# Patient Record
Sex: Female | Born: 1970
Health system: Southern US, Community
[De-identification: ages and names within clinical notes are randomized; demographics above are authoritative.]

## PROBLEM LIST (undated history)

## (undated) DIAGNOSIS — L719 Rosacea, unspecified: Secondary | ICD-10-CM

## (undated) DIAGNOSIS — N879 Dysplasia of cervix uteri, unspecified: Secondary | ICD-10-CM

## (undated) DIAGNOSIS — Z803 Family history of malignant neoplasm of breast: Secondary | ICD-10-CM

## (undated) DIAGNOSIS — Z1371 Encounter for nonprocreative screening for genetic disease carrier status: Secondary | ICD-10-CM

## (undated) HISTORY — DX: Encounter for nonprocreative screening for genetic disease carrier status: Z13.71

## (undated) HISTORY — DX: Family history of malignant neoplasm of breast: Z80.3

## (undated) HISTORY — DX: Rosacea, unspecified: L71.9

## (undated) HISTORY — DX: Dysplasia of cervix uteri, unspecified: N87.9

---

## 1996-07-03 HISTORY — PX: CRYOTHERAPY: SHX1416

## 2010-01-31 ENCOUNTER — Ambulatory Visit: Payer: Self-pay | Admitting: Internal Medicine

## 2011-11-03 DIAGNOSIS — Z1371 Encounter for nonprocreative screening for genetic disease carrier status: Secondary | ICD-10-CM

## 2011-11-03 HISTORY — DX: Encounter for nonprocreative screening for genetic disease carrier status: Z13.71

## 2014-02-28 ENCOUNTER — Ambulatory Visit: Payer: Self-pay

## 2015-02-20 ENCOUNTER — Other Ambulatory Visit: Payer: Self-pay | Admitting: Certified Nurse Midwife

## 2015-02-20 DIAGNOSIS — Z1231 Encounter for screening mammogram for malignant neoplasm of breast: Secondary | ICD-10-CM

## 2015-03-04 ENCOUNTER — Ambulatory Visit
Admission: RE | Admit: 2015-03-04 | Discharge: 2015-03-04 | Disposition: A | Discharge status: Home / Self Care | Payer: BLUE CROSS/BLUE SHIELD | Source: Ambulatory Visit | Attending: Certified Nurse Midwife | Admitting: Certified Nurse Midwife

## 2015-03-04 ENCOUNTER — Other Ambulatory Visit: Payer: Self-pay | Admitting: Certified Nurse Midwife

## 2015-03-04 DIAGNOSIS — Z1231 Encounter for screening mammogram for malignant neoplasm of breast: Secondary | ICD-10-CM | POA: Diagnosis not present

## 2016-03-02 ENCOUNTER — Other Ambulatory Visit: Payer: Self-pay | Admitting: Certified Nurse Midwife

## 2016-04-09 ENCOUNTER — Ambulatory Visit (INDEPENDENT_AMBULATORY_CARE_PROVIDER_SITE_OTHER): Payer: BLUE CROSS/BLUE SHIELD | Admitting: Certified Nurse Midwife

## 2016-04-09 ENCOUNTER — Encounter: Payer: Self-pay | Admitting: Certified Nurse Midwife

## 2016-04-09 VITALS — BP 92/62 | HR 74 | Ht 61.0 in | Wt 114.0 lb

## 2016-04-09 DIAGNOSIS — Z01419 Encounter for gynecological examination (general) (routine) without abnormal findings: Secondary | ICD-10-CM

## 2016-04-09 DIAGNOSIS — Z124 Encounter for screening for malignant neoplasm of cervix: Secondary | ICD-10-CM | POA: Diagnosis not present

## 2016-04-09 DIAGNOSIS — Z803 Family history of malignant neoplasm of breast: Secondary | ICD-10-CM

## 2016-04-09 DIAGNOSIS — Z87410 Personal history of cervical dysplasia: Secondary | ICD-10-CM | POA: Insufficient documentation

## 2016-04-09 NOTE — Progress Notes (Signed)
Gynecology Annual Exam  PCP: No PCP Per Patient  Chief Complaint:  Chief Complaint  Patient presents with  . Gynecologic Exam    History of Present Illness: Patient is a 46 y.o. [id number] presents for annual exam. The patient has no complaints today.   LMP: Patient's last menstrual period was 03/26/2016 (exact date). Menses are regular, every 21 days, lasting 5 days with 2-3 heavier days accompanied by cramping. She takes ibuprofen 400 mgm q4 hrs prn pain with relief. She has no intermenstrual spotting  The patient is sexually active. She currently uses vasectomy for contraception.  Since her most recent annual exam 02/13/2015, she has had no changes in her health history, however her mother died at age 60 from metastatic breast cancer. Her most recent Pap smear was 02/13/2015 and was NIL/neg. Her most recent mammogram was 03/05/2015 and was negative. There is a positive history of breast cancer in her maternal grandmother, maternal great aunt, and mother. Genetic testing was done. The patient and mother tested negative for HBOC.  Patient's lifetime risk of breast cancer is 23.1%. She has declined MRIs due to cost, but does get 3D mammograms annually.  There is no family history of ovarian cancer The patient does do monthly self breast exams. SHe does not smoke or drink alcohol The patient does get some exercise by walking. The patient gets adequate calcium in her diet and calcium supplement. SHe had a recent cholesterol screen in 2015 that was normal.  Review of Systems: Review of Systems  Constitutional: Negative for chills, fever and weight loss.  HENT: Negative for congestion, sinus pain and sore throat.   Eyes: Negative for blurred vision and pain.  Respiratory: Negative for hemoptysis, shortness of breath and wheezing.   Cardiovascular: Negative for chest pain, palpitations and leg swelling.  Gastrointestinal: Negative for abdominal pain, blood in stool, diarrhea, heartburn, nausea  and vomiting.  Genitourinary: Negative for dysuria, frequency, hematuria and urgency.       Positive for dysmenorrhea  Musculoskeletal: Negative for back pain, joint pain and myalgias.  Skin: Negative for itching and rash.  Neurological: Negative for dizziness, tingling and headaches.  Endo/Heme/Allergies: Negative for environmental allergies and polydipsia. Does not bruise/bleed easily.       Negative for hirsutism   Psychiatric/Behavioral: Negative for depression. The patient is not nervous/anxious and does not have insomnia.   All other systems reviewed and are negative.    Past Medical History:  Past Medical History:  Diagnosis Date  . BRCA negative 11/2011  . Cervical dysplasia    1998  . Family history of breast cancer     Past Surgical History:  Past Surgical History:  Procedure Laterality Date  . CRYOTHERAPY  07/03/1996   CIN 1    Medications: Prior to Admission medications   Medication Sig Start Date End Date Taking? Authorizing Provider  Calcium Carb-Cholecalciferol (CALCIUM-VITAMIN D) 500-200 MG-UNIT tablet Take 1 tablet by mouth daily.   Yes Historical Provider, MD  ibuprofen (ADVIL,MOTRIN) 200 MG tablet Take 400 mg by mouth every 4 (four) hours as needed for cramping.   Yes Historical Provider, MD    Allergies:  Allergies  Allergen Reactions  . Erythromycin Base     Gynecologic History: Obstetric History: [id number]  Social History:  Social History   Social History  . Marital status: Married    Spouse name: N/A  . Number of children: 2  . Years of education: N/A   Occupational History  . Not on file.  Social History Main Topics  . Smoking status: Never Smoker  . Smokeless tobacco: Never Used  . Alcohol use No  . Drug use: No  . Sexual activity: Yes   Other Topics Concern  . Not on file   Social History Narrative  . No narrative on file    Family History:  Family History  Problem Relation Age of Onset  . Breast cancer Mother 33  .  Stomach cancer Mother   . Heart failure Father   . Breast cancer Maternal Grandmother 20  . Skin cancer Maternal Grandmother   . Stomach cancer Maternal Grandmother   . Pancreatic cancer Maternal Grandfather   . Breast cancer Other   . Breast cancer Other      Physical Exam Vitals:  Vitals:   04/09/16 1534  BP: 92/62  Pulse: 74    Physical Exam  Constitutional: She is oriented to person, place, and time and well-developed, well-nourished, and in no distress.  HENT:  Head: Normocephalic.  Mouth/Throat: Oropharynx is clear and moist.  Eyes: Conjunctivae are normal.  Neck: Normal range of motion. No thyromegaly present.  Cardiovascular: Normal rate and regular rhythm.   No murmur heard. Pulmonary/Chest: Effort normal. She has no wheezes. She has no rales. She exhibits no tenderness.  Abdominal: Soft. She exhibits no distension and no mass. There is no tenderness. There is no guarding.  Genitourinary:  Genitourinary Comments: Vulva: no lesions or inflammation Vagina: no inflammation, scant clear discharge Cervix: stenotic os, NT Uterus: AV, nssc, mobile, NT Adnexa: no masses, NT Rectal: deferred  Musculoskeletal: She exhibits no edema.  Lymphadenopathy:    She has no cervical adenopathy.  Neurological: She is alert and oriented to person, place, and time. Gait normal.  Skin: Skin is warm and dry. No rash noted.  Psychiatric: Mood, affect and judgment normal.      Assessment: 46 y.o. W2N5621 with normal well woman exam Plan: Problem List Items Addressed This Visit    Family history of breast cancer    Other Visit Diagnoses    Encounter for gynecological examination    -  Primary   Relevant Orders   IGP,rfx Aptima HPV all pth   Screening for cervical cancer       Relevant Orders   IGP,rfx Aptima HPV all pth       1) Pap done  2) Routine healthcare maintenance including cholesterol, diabetes screening in 2020  3) Follow up 1 year for routine annual  exam  4) Patient to schedule annual screening 3D mammogram at [facility]  5). Discussed getting update on genetic screening with MYRISK testing. Patient to consider and let me know if she desires.  Dalia Heading, CNM  04/09/2016 5:12 PM

## 2016-04-10 ENCOUNTER — Other Ambulatory Visit: Payer: Self-pay | Admitting: Certified Nurse Midwife

## 2016-04-10 DIAGNOSIS — Z1231 Encounter for screening mammogram for malignant neoplasm of breast: Secondary | ICD-10-CM

## 2016-04-14 LAB — IGP,RFX APTIMA HPV ALL PTH: PAP Smear Comment: 0

## 2016-05-11 ENCOUNTER — Ambulatory Visit
Admission: RE | Admit: 2016-05-11 | Discharge: 2016-05-11 | Disposition: A | Discharge status: Home / Self Care | Payer: BLUE CROSS/BLUE SHIELD | Source: Ambulatory Visit | Attending: Certified Nurse Midwife | Admitting: Certified Nurse Midwife

## 2016-05-11 DIAGNOSIS — Z1231 Encounter for screening mammogram for malignant neoplasm of breast: Secondary | ICD-10-CM

## 2017-03-03 ENCOUNTER — Encounter: Payer: Self-pay | Admitting: Certified Nurse Midwife

## 2017-03-03 ENCOUNTER — Ambulatory Visit: Payer: BLUE CROSS/BLUE SHIELD | Admitting: Certified Nurse Midwife

## 2017-03-03 VITALS — BP 102/62 | HR 80 | Ht 61.0 in | Wt 118.0 lb

## 2017-03-03 DIAGNOSIS — N951 Menopausal and female climacteric states: Secondary | ICD-10-CM | POA: Diagnosis not present

## 2017-03-12 ENCOUNTER — Encounter: Payer: Self-pay | Admitting: Certified Nurse Midwife

## 2017-03-12 NOTE — Progress Notes (Signed)
Obstetrics & Gynecology Office Visit   Chief Complaint:  Chief Complaint  Patient presents with  . Menopause    menopausal symptoms    History of Present Illness: 47 year old G2P2, LMP 01/02/2018, presents with complaints of hot flashes and irregular menses. Hot flashes started shortly after her last annual in March 2018. They are worse at night disrupting her sleep. Has about 4 hot flashes during the day, but they do not bother her as much. Her menses are every 3-4 weeks apart, but reports 2 menses a month for the past 2 months. She desires information on the treatment of menopausal sx, specifically vasomotor symptoms. There is a past family history of breast cancer  In her mother, MGM, MGGM, and maternal great aunt. She has had HBOC testing which was negative. Her lifetime risk of breast cancer is 23%.   LAst mammogram 05/2016 was negative. Has declined MRIs of the breast and MYRISK testing update. Denies history of heart, liver, migraines, or clotting disorder.  Review of Systems:  ROS -see HPI  Past Medical History:  Past Medical History:  Diagnosis Date  . BRCA negative 11/2011  . Cervical dysplasia    1998  . Family history of breast cancer    mother, MGM, MGGM, mat great aunt    Past Surgical History:  Past Surgical History:  Procedure Laterality Date  . CRYOTHERAPY  07/03/1996   CIN 1    Gynecologic History: Patient's last menstrual period was 01/02/2017 (exact date).  Obstetric History: [id number]  Family History:  Family History  Problem Relation Age of Onset  . Breast cancer Mother 64  . Stomach cancer Mother   . Heart failure Father   . Breast cancer Maternal Grandmother 32  . Skin cancer Maternal Grandmother   . Stomach cancer Maternal Grandmother   . Pancreatic cancer Maternal Grandfather   . Breast cancer Other   . Breast cancer Other     Social History:  Social History   Socioeconomic History  . Marital status: Married    Spouse name: Not on file   . Number of children: 2  . Years of education: Not on file  . Highest education level: Not on file  Social Needs  . Financial resource strain: Not on file  . Food insecurity - worry: Not on file  . Food insecurity - inability: Not on file  . Transportation needs - medical: Not on file  . Transportation needs - non-medical: Not on file  Occupational History  . Not on file  Tobacco Use  . Smoking status: Never Smoker  . Smokeless tobacco: Never Used  Substance and Sexual Activity  . Alcohol use: No  . Drug use: No  . Sexual activity: Yes    Birth control/protection: Other-see comments    Comment: vasectomy  Other Topics Concern  . Not on file  Social History Narrative  . Not on file    Allergies:  Allergies  Allergen Reactions  . Erythromycin Base     Medications: Prior to Admission medications   Medication Sig Start Date End Date Taking? Authorizing Provider  Calcium Carb-Cholecalciferol (CALCIUM-VITAMIN D) 500-200 MG-UNIT tablet Take 1 tablet by mouth daily.   Yes [provider]  ibuprofen (ADVIL,MOTRIN) 200 MG tablet Take 400 mg by mouth every 4 (four) hours as needed for cramping.   Yes [provider]    Physical Exam Vitals:BP 102/62   Pulse 80   Ht '5\' 1"'$  (1.549 m)   Wt 118 lb (  53.5 kg)   LMP 01/02/2017 (Exact Date)   BMI 22.30 kg/m  Patient's last menstrual period was 01/02/2017 (exact date).  Physical Exam  Constitutional: She is oriented to person, place, and time. She appears well-developed and well-nourished. No distress.  Cardiovascular: Normal rate.  Respiratory: Effort normal.  Neurological: She is alert and oriented to person, place, and time.  Skin: Skin is warm and dry.  Psychiatric: She has a normal mood and affect. Her behavior is normal. Judgment and thought content normal.     Assessment: 47 y.o. J1T9539 with perimenopausal symptoms  Plan: Menstrual calendar Discussed treatment of vasomotor symptoms with hormonal  meds , prescription nonhormonal medications (Brisdelle, Fosteum), OTC medications (black cohash, phytoestrogens, vitamin E).  Discussed pros and cons of each discussed. Reviewed WHI study results.  Patient decided to use OTC treatment first. To keep menstrual calande and follow up at time of her annual in March  Sigrid Schwebach [place], [place]

## 2017-04-12 ENCOUNTER — Ambulatory Visit (INDEPENDENT_AMBULATORY_CARE_PROVIDER_SITE_OTHER): Payer: BLUE CROSS/BLUE SHIELD | Admitting: Certified Nurse Midwife

## 2017-04-12 ENCOUNTER — Encounter: Payer: Self-pay | Admitting: Certified Nurse Midwife

## 2017-04-12 VITALS — BP 100/60 | HR 73 | Ht 61.0 in | Wt 118.0 lb

## 2017-04-12 DIAGNOSIS — Z124 Encounter for screening for malignant neoplasm of cervix: Secondary | ICD-10-CM

## 2017-04-12 DIAGNOSIS — Z01419 Encounter for gynecological examination (general) (routine) without abnormal findings: Secondary | ICD-10-CM

## 2017-04-12 DIAGNOSIS — Z1239 Encounter for other screening for malignant neoplasm of breast: Secondary | ICD-10-CM

## 2017-04-12 DIAGNOSIS — Z1231 Encounter for screening mammogram for malignant neoplasm of breast: Secondary | ICD-10-CM | POA: Diagnosis not present

## 2017-04-12 DIAGNOSIS — N926 Irregular menstruation, unspecified: Secondary | ICD-10-CM

## 2017-04-12 DIAGNOSIS — Z9189 Other specified personal risk factors, not elsewhere classified: Secondary | ICD-10-CM

## 2017-04-12 NOTE — Progress Notes (Signed)
Gynecology Annual Exam  PCP: Patient, No Pcp Per  Chief Complaint:  Chief Complaint  Patient presents with  . Gynecologic Exam    History of Present Illness: Patient is a 47PY [id number] presents for annual exam. The patient has had some problems with irregular and heavier menses and vasomotor symptoms. Her menses had become more frequent last year, but she skipped January and February this year and had her first menses in 2019 from 3/4 to 3/10. She had 2 heavier days requiring pad changes every 1 hour. The heavier days were accompanied by cramping. She takes ibuprofen 400 mgm two or three times a day with relief. She has no intermenstrual spotting She is taking Vitamin E 400 IU BID for hot flashes. The hot flashes are not as frequent or bothersome.  The patient is sexually active. She currently uses vasectomy for contraception.  Since her most recent annual exam 04/09/2016, she has had no changes in her health history. Her most recent Pap smear was 04/09/2016 and was NIL Remote hx of CIN1 1998. Had cryotherapy. Her most recent mammogram was 05/11/2016 and was negative. There is a positive history of breast cancer in her maternal grandmother, maternal great aunt, and mother. Genetic testing was done. The patient and mother tested negative for HBOC.  Patient's lifetime risk of breast cancer is 23.1%. She has declined MRIs due to cost, but does get 3D mammograms annually. She has also declined MYRISK testing. There is no family history of ovarian cancer The patient does do monthly self breast exams. SHe does not smoke or drink alcohol The patient does get some exercise by walking (6000steps/day) The patient gets adequate calcium in her diet and calcium supplement. She had a recent cholesterol screen in the last 2-3 years at work that was normal  Review of Systems: Review of Systems  Constitutional: Negative for chills, fever and weight loss.  HENT: Negative for congestion, sinus pain and  sore throat.   Eyes: Negative for blurred vision and pain.  Respiratory: Negative for hemoptysis, shortness of breath and wheezing.   Cardiovascular: Negative for chest pain, palpitations and leg swelling.  Gastrointestinal: Negative for abdominal pain, blood in stool, diarrhea, heartburn, nausea and vomiting.  Genitourinary: Negative for dysuria, frequency, hematuria and urgency.       Positive for dysmenorrhea, irregular menses  Musculoskeletal: Negative for back pain, joint pain and myalgias.  Skin: Negative for itching and rash.  Neurological: Negative for dizziness, tingling and headaches.  Endo/Heme/Allergies: Negative for environmental allergies and polydipsia. Does not bruise/bleed easily.       Negative for hirsutism. Positive for hot flashes   Psychiatric/Behavioral: Negative for depression. The patient is not nervous/anxious and does not have insomnia.   All other systems reviewed and are negative.    Past Medical History:  Past Medical History:  Diagnosis Date  . BRCA negative 11/2011  . Cervical dysplasia    1998  . Family history of breast cancer    mother, MGM, MGGM, mat great aunt  . Rosacea     Past Surgical History:  Past Surgical History:  Procedure Laterality Date  . CRYOTHERAPY  07/03/1996   CIN 1    Medications: Allergies:  Allergies  Allergen Reactions  . Erythromycin Base     Gynecologic History: Obstetric History: [id number]  Social History:  Social History   Socioeconomic History  . Marital status: Married    Spouse name: Not on file  . Number of children: 2  .  Years of education: Not on file  . Highest education level: Not on file  Social Needs  . Financial resource strain: Not on file  . Food insecurity - worry: Not on file  . Food insecurity - inability: Not on file  . Transportation needs - medical: Not on file  . Transportation needs - non-medical: Not on file  Occupational History  . Occupation: Accounting  Tobacco Use  .  Smoking status: Never Smoker  . Smokeless tobacco: Never Used  Substance and Sexual Activity  . Alcohol use: No  . Drug use: No  . Sexual activity: Yes    Birth control/protection: Other-see comments    Comment: vasectomy  Other Topics Concern  . Not on file  Social History Narrative  . Not on file    Family History:  Family History  Problem Relation Age of Onset  . Breast cancer Mother 76  . Stomach cancer Mother 31  . Valvular heart disease Father        mitral valve surgery  . Breast cancer Maternal Grandmother 30  . Stomach cancer Maternal Grandmother 77  . Melanoma Maternal Grandmother   . Pancreatic cancer Maternal Grandfather   . Breast cancer Other   . Breast cancer Other    Medication:  Current Outpatient Medications:  .  Calcium Carb-Cholecalciferol (CALCIUM-VITAMIN D) 500-200 MG-UNIT tablet, Take 1 tablet by mouth daily., Disp: , Rfl:  .  ibuprofen (ADVIL,MOTRIN) 200 MG tablet, Take 400 mg by mouth every 4 (four) hours as needed for cramping., Disp: , Rfl:  .  metroNIDAZOLE (METROCREAM) 0.75 % cream, Apply 1 application topically 2 (two) times daily., Disp: , Rfl:  .  vitamin E 400 UNIT capsule, Take 400 Units by mouth 2 (two) times daily., Disp: , Rfl:   Physical Exam Vitals:BP 100/60   Pulse 73   Ht '5\' 1"'$  (1.549 m)   Wt 118 lb (53.5 kg)   LMP 04/05/2017 (Exact Date)   BMI 22.30 kg/m    Physical Exam  Constitutional: She is oriented to person, place, and time and well-developed, well-nourished, and in no distress.  HENT:  Head: Normocephalic.  Eyes: Conjunctivae are normal.  Neck: Normal range of motion. No thyromegaly present. No cervical lymphadenopathy Cardiovascular: Normal rate and regular rhythm.   No murmur heard. Pulmonary/Chest: Effort normal. She has no wheezes. She has no rales. She exhibits no tenderness.  Breast: soft, no masses, no skin changes, no nipple retraction. No axillary, supraclavicular or infraclavicular  lymphadenopathy. Abdominal: Soft. She exhibits no distension and no mass. There is no tenderness. There is no guarding.  Genitourinary:  Genitourinary Comments: Vulva: no lesions or inflammation Vagina: no inflammation, scant clear discharge Cervix: stenotic os, NT Uterus: AV, nssc, mobile, NT Adnexa: no masses, NT Rectal: deferred  Musculoskeletal: She exhibits no edema.  Lymphadenopathy:    She has no cervical adenopathy.  Neurological: She is alert and oriented to person, place, and time. Gait normal.  Skin: Skin is warm and dry. No rash noted.  Psychiatric: Mood, affect and judgment normal.      Assessment: 47 y.o. [id number] with normal well woman exam Plan:  1) Pap done  2) Routine healthcare maintenance including cholesterol, diabetes screening done through work  3) Follow up 1 year for routine annual exam  4) Patient to schedule annual screening 3D mammogram at [facility]  5). Discussed getting update on genetic screening with MYRISK testing. Patient to consider and let me know if she desires.  6) Encouraged some exercises/strength  training to help prevent osteoporosis. Reviewed calcium and vitamin D3 requirements.  7) Continue menstrual calendar and let me know about prolonged, too frequent, intermenstrual or persistent heavy bleeding.  Dalia Heading, CNM  04/19/2017 4:37 PM

## 2017-04-13 LAB — IGP,RFX APTIMA HPV ALL PTH: PAP Smear Comment: 0

## 2017-04-19 ENCOUNTER — Encounter: Payer: Self-pay | Admitting: Certified Nurse Midwife

## 2017-04-19 DIAGNOSIS — Z9189 Other specified personal risk factors, not elsewhere classified: Secondary | ICD-10-CM | POA: Insufficient documentation

## 2017-05-12 ENCOUNTER — Ambulatory Visit
Admission: RE | Admit: 2017-05-12 | Discharge: 2017-05-12 | Disposition: A | Discharge status: Home / Self Care | Payer: BLUE CROSS/BLUE SHIELD | Source: Ambulatory Visit | Attending: Certified Nurse Midwife | Admitting: Certified Nurse Midwife

## 2017-05-12 DIAGNOSIS — Z1231 Encounter for screening mammogram for malignant neoplasm of breast: Secondary | ICD-10-CM | POA: Diagnosis not present

## 2017-05-12 DIAGNOSIS — Z1239 Encounter for other screening for malignant neoplasm of breast: Secondary | ICD-10-CM

## 2017-06-09 DIAGNOSIS — L719 Rosacea, unspecified: Secondary | ICD-10-CM | POA: Insufficient documentation

## 2018-05-06 ENCOUNTER — Ambulatory Visit: Payer: BLUE CROSS/BLUE SHIELD | Admitting: Certified Nurse Midwife

## 2018-06-06 ENCOUNTER — Other Ambulatory Visit (HOSPITAL_COMMUNITY)
Admission: RE | Admit: 2018-06-06 | Discharge: 2018-06-06 | Disposition: A | Discharge status: Home / Self Care | Payer: BLUE CROSS/BLUE SHIELD | Source: Ambulatory Visit | Attending: Certified Nurse Midwife | Admitting: Certified Nurse Midwife

## 2018-06-06 ENCOUNTER — Ambulatory Visit (INDEPENDENT_AMBULATORY_CARE_PROVIDER_SITE_OTHER): Payer: BLUE CROSS/BLUE SHIELD | Admitting: Certified Nurse Midwife

## 2018-06-06 ENCOUNTER — Encounter: Payer: Self-pay | Admitting: Certified Nurse Midwife

## 2018-06-06 ENCOUNTER — Other Ambulatory Visit: Payer: Self-pay

## 2018-06-06 VITALS — BP 100/60 | Ht 61.0 in | Wt 118.0 lb

## 2018-06-06 DIAGNOSIS — Z124 Encounter for screening for malignant neoplasm of cervix: Secondary | ICD-10-CM | POA: Diagnosis not present

## 2018-06-06 DIAGNOSIS — Z01419 Encounter for gynecological examination (general) (routine) without abnormal findings: Secondary | ICD-10-CM

## 2018-06-06 DIAGNOSIS — Z9189 Other specified personal risk factors, not elsewhere classified: Secondary | ICD-10-CM

## 2018-06-06 DIAGNOSIS — Z1211 Encounter for screening for malignant neoplasm of colon: Secondary | ICD-10-CM | POA: Diagnosis not present

## 2018-06-06 DIAGNOSIS — Z1239 Encounter for other screening for malignant neoplasm of breast: Secondary | ICD-10-CM

## 2018-06-06 LAB — HEMOCCULT GUIAC POC 1CARD (OFFICE): Fecal Occult Blood, POC: NEGATIVE

## 2018-06-06 MED ORDER — PAROXETINE HCL 10 MG PO TABS: 10.0000 mg | ORAL_TABLET | Freq: Every day | ORAL | 11 refills | Status: DC

## 2018-06-06 NOTE — Progress Notes (Addendum)
Gynecology Annual Exam  PCP: Patient, No Pcp Per  Chief Complaint:  Chief Complaint  Patient presents with  . Gynecologic Exam    History of Present Illness: Cheyenne Cook  is a 48yo WF [id number] who presents for her annual exam. The patient has had some problems with irregular and heavier menses and vasomotor symptoms x2 years. Her menses had become less frequent over the last year and have been every 1-4.5 months apart, lasting 6-10 days with 2-7 heavier days requiring a pad change every 1-2 hours. She had her LMP Jan30-Feb 4.  Often times she would soil her clothes overnight. The heavier days were also accompanied by cramping. She takes ibuprofen 400 mgm two or three times a day with relief. She has no intermenstrual spotting She is taking Vitamin E 400 IU BID for hot flashes. The hot flashes have become more frequent since February. She reports awakening 6 times during the night with hot flashes. .  The patient is sexually active. She currently uses vasectomy for contraception.  Since her most recent annual exam 04/12/2017, she has had no other changes in her health history. Her most recent Pap smear was 04/12/17 and was NIL Remote hx of CIN1 1998. Had cryotherapy. Her most recent mammogram was 05/12/2017 and was negative. There is a positive history of breast cancer in her maternal grandmother, maternal great aunt, and mother. Genetic testing was done. The patient and mother tested negative for HBOC.  Patient's lifetime risk of breast cancer is 23.1%. She has declined MRIs due to cost, but does get 3D mammograms annually. She has also declined MYRISK testing. There is no family history of ovarian cancer The patient does do monthly self breast exams. SHe does not smoke or drink alcohol The patient does get some exercise by walking (6000steps/day) The patient gets adequate calcium in her diet and calcium supplement. She had a recent cholesterol screen in the last year at work that was  normal. She also reports that her CBC was normal. I have asked her to fax me those results  Review of Systems: Review of Systems  Constitutional: Negative for chills, fever and weight loss.  HENT: Negative for congestion, sinus pain and sore throat.   Eyes: Negative for blurred vision and pain.  Respiratory: Negative for hemoptysis, shortness of breath and wheezing.   Cardiovascular: Negative for chest pain, palpitations and leg swelling.  Gastrointestinal: Negative for abdominal pain, blood in stool, diarrhea, heartburn, nausea and vomiting.  Genitourinary: Negative for dysuria, frequency, hematuria and urgency.       Positive for dysmenorrhea, irregular menses, menorrhagia  Musculoskeletal: Negative for back pain, joint pain and myalgias.  Skin: Negative for itching and rash.  Neurological: Negative for dizziness, tingling and headaches.  Endo/Heme/Allergies: Negative for environmental allergies and polydipsia. Does not bruise/bleed easily.       Negative for hirsutism. Positive for hot flashes   Psychiatric/Behavioral: Negative for depression. The patient is not nervous/anxious and does not have insomnia.   All other systems reviewed and are negative.    Past Medical History:  Past Medical History:  Diagnosis Date  . BRCA negative 11/2011  . Cervical dysplasia    1998  . Family history of breast cancer    mother, MGM, MGGM, mat great aunt  . Rosacea     Past Surgical History:  Past Surgical History:  Procedure Laterality Date  . CRYOTHERAPY  07/03/1996   CIN 1    Medications: Allergies:  Allergies  Allergen Reactions  . Erythromycin Base     Gynecologic History: Obstetric History: [id number]  Social History:  Social History   Socioeconomic History  . Marital status: Married    Spouse name: Not on file  . Number of children: 2  . Years of education: Not on file  . Highest education level: Not on file  Social Needs  . Financial resource strain: Not on file  .  Food insecurity - worry: Not on file  . Food insecurity - inability: Not on file  . Transportation needs - medical: Not on file  . Transportation needs - non-medical: Not on file  Occupational History  . Occupation: Accounting  Tobacco Use  . Smoking status: Never Smoker  . Smokeless tobacco: Never Used  Substance and Sexual Activity  . Alcohol use: No  . Drug use: No  . Sexual activity: Yes    Birth control/protection: Other-see comments    Comment: vasectomy  Other Topics Concern  . Not on file  Social History Narrative  . Not on file    Family History:  Family History  Problem Relation Age of Onset  . Breast cancer Mother 33  . Stomach cancer Mother 39  . Valvular heart disease Father        mitral valve surgery  . Breast cancer Maternal Grandmother 16  . Stomach cancer Maternal Grandmother 77  . Melanoma Maternal Grandmother   . Pancreatic cancer Maternal Grandfather   . Breast cancer Other   . Breast cancer Other    Medication:  Current Outpatient Medications:  Current Outpatient Medications on File Prior to Visit  Medication Sig Dispense Refill  . Calcium Carb-Cholecalciferol (CALCIUM-VITAMIN D) 500-200 MG-UNIT tablet Take 1 tablet by mouth daily.    . Cholecalciferol (VITAMIN D3) 25 MCG (1000 UT) CAPS Take 1 capsule by mouth daily.    . metroNIDAZOLE (METROCREAM) 0.75 % cream Apply 1 application topically 2 (two) times daily.    . vitamin E 400 UNIT capsule Take 400 Units by mouth 2 (two) times daily.    Marland Kitchen ibuprofen (ADVIL,MOTRIN) 200 MG tablet Take 400 mg by mouth every 4 (four) hours as needed for cramping.     No current facility-administered medications on file prior to visit.        Physical Exam Vitals: BP 100/60   Ht '5\' 1"'$  (1.549 m)   Wt 118 lb (53.5 kg)   LMP 03/08/2018   BMI 22.30 kg/m   Physical Exam  Constitutional: She is oriented to person, place, and time and well-developed, well-nourished, and in no distress.  HENT:  Head:  Normocephalic.  Eyes: Conjunctivae are normal.  Neck: Normal range of motion. No thyromegaly present. No cervical lymphadenopathy Cardiovascular: Normal rate and regular rhythm.   No murmur heard. Pulmonary/Chest: Effort normal. She has no wheezes. She has no rales.  Breast: soft, no masses, no skin changes, no nipple retraction. No axillary, supraclavicular or infraclavicular lymphadenopathy. Abdominal: Soft. She exhibits no distension and no mass. There is no tenderness. There is no guarding.  Genitourinary:  Genitourinary Comments: Vulva: no lesions or inflammation Vagina: no inflammation, scant clear discharge Cervix: stenotic os, NT, anterior, cervix almost flush with posterior fornix. Uterus: RF, nssc, mobile, NT Adnexa: no masses, NT Rectal: no masses, hemoccult negative. Musculoskeletal: She exhibits no edema.  Lymphadenopathy:    She has no cervical adenopathy.  Neurological: She is alert and oriented to person, place, and time. Gait normal.  Skin: Skin is warm and dry. No rash noted.  Psychiatric: Mood, affect and judgment normal.      Assessment: 48 y.o. [id number] with normal well woman exam Perimenopausal bleeding/ vasomotor symptoms Family history of breast cancer Increased risk of breast cancer  Plan:  1) Cervical cancer screening:Pap done (patient desires Pap smears every year)  2) Routine healthcare maintenance including cholesterol, diabetes screening done through work  3) Colon cancer screening:  Discussed options and she desires annual hemoccult testing. This was negative today.  4) Patient to schedule annual screening 3D mammogram at [facility] when Covid 19 restrictions allow. Discussed getting update  MYRISK testing in the past and she has declines  5) Vasomotor symptoms: explained non hormonal options to treat vasomotor symptoms. She would like trial of Brisdelle. Will try generic Paxil 10 mgm daily. DIscussed possible side effects.   6) Osteoporosis  prevention:.calcium and vitamin D3 supplements/ exercise  7) RTO 1 year and prn. Continue menstrual calendar and let me know about prolonged, too frequent, intermenstrual or persistent heavy bleeding.  Dalia Heading, CNM  04/19/2017 4:37 PM

## 2018-06-07 ENCOUNTER — Encounter: Payer: Self-pay | Admitting: Certified Nurse Midwife

## 2018-06-08 LAB — CYTOLOGY - PAP
Diagnosis: NEGATIVE
HPV: NOT DETECTED

## 2018-08-09 ENCOUNTER — Other Ambulatory Visit: Payer: Self-pay

## 2018-08-09 ENCOUNTER — Ambulatory Visit
Admission: RE | Admit: 2018-08-09 | Discharge: 2018-08-09 | Disposition: A | Discharge status: Home / Self Care | Payer: BC Managed Care – PPO | Source: Ambulatory Visit | Attending: Certified Nurse Midwife | Admitting: Certified Nurse Midwife

## 2018-08-09 DIAGNOSIS — Z1239 Encounter for other screening for malignant neoplasm of breast: Secondary | ICD-10-CM

## 2018-08-09 DIAGNOSIS — Z1231 Encounter for screening mammogram for malignant neoplasm of breast: Secondary | ICD-10-CM | POA: Insufficient documentation

## 2018-08-09 DIAGNOSIS — Z9189 Other specified personal risk factors, not elsewhere classified: Secondary | ICD-10-CM | POA: Diagnosis not present

## 2018-09-02 IMAGING — MG MM DIGITAL SCREENING BILAT W/ TOMO W/ CAD
9 of 12 series · 9 of 28 positions shown · non-contrast
Comparison: Previous exam(s).

CLINICAL DATA: Screening.

EXAM:
DIGITAL SCREENING BILATERAL MAMMOGRAM WITH TOMO AND CAD

[R CC synth-2D]
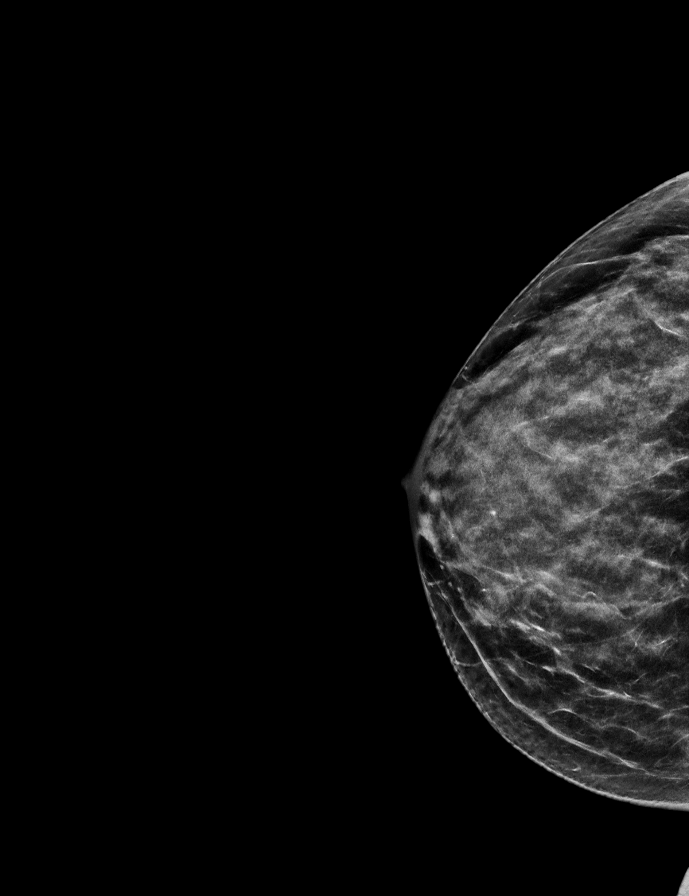

[L CC synth-2D]
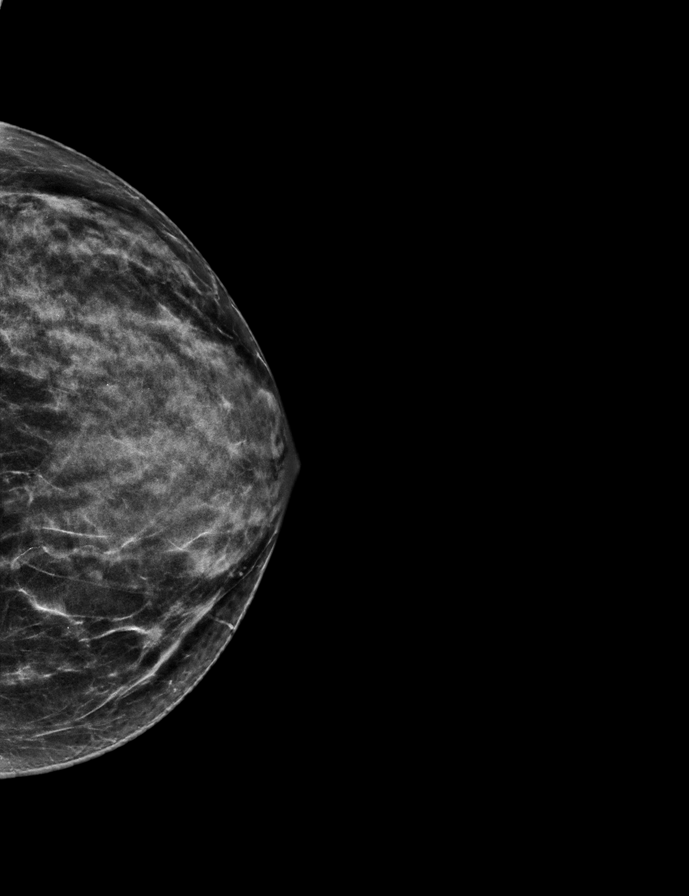

[L CC]
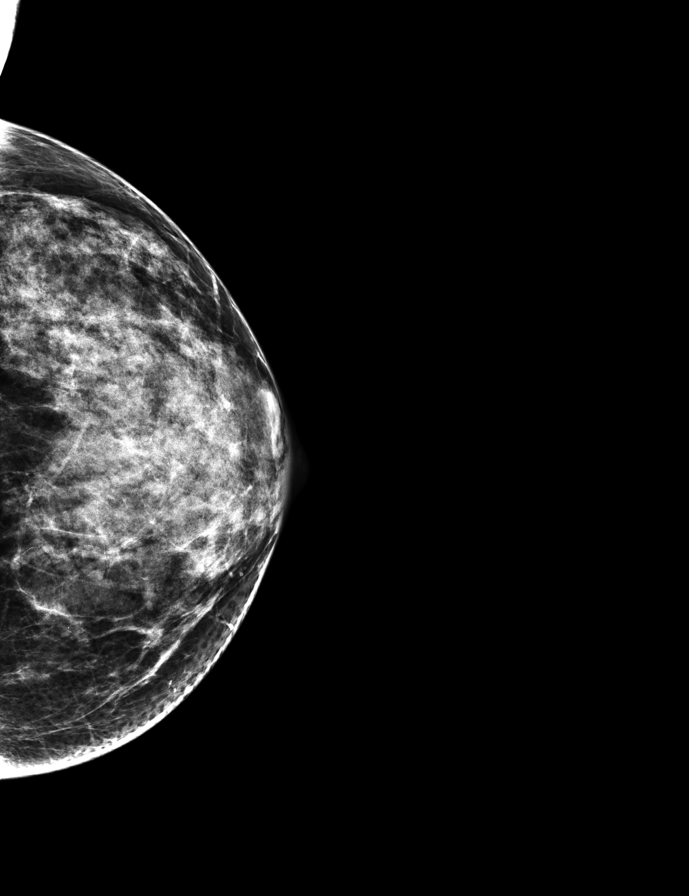

[L MLO synth-2D]
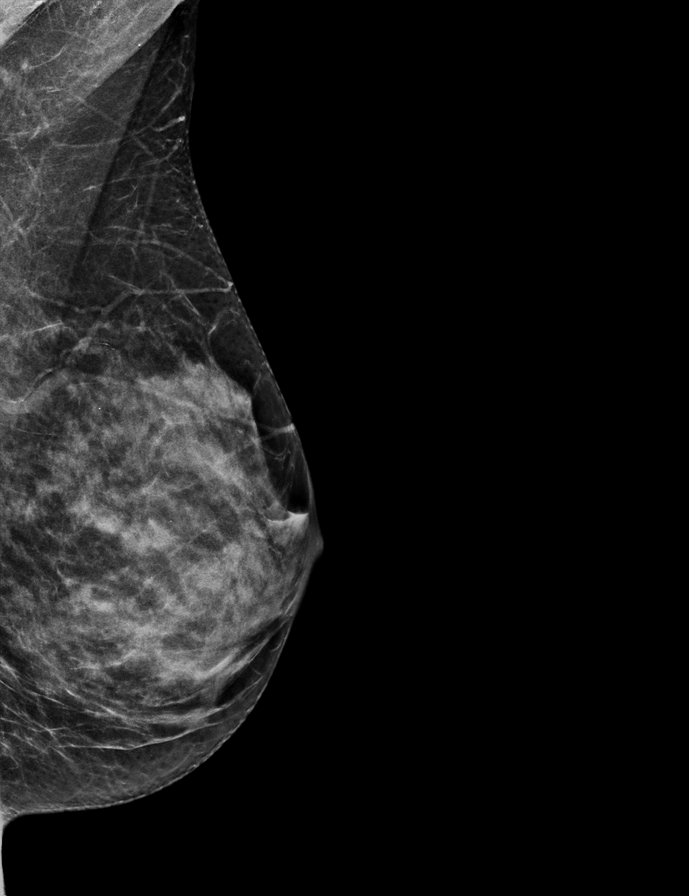

[L MLO]
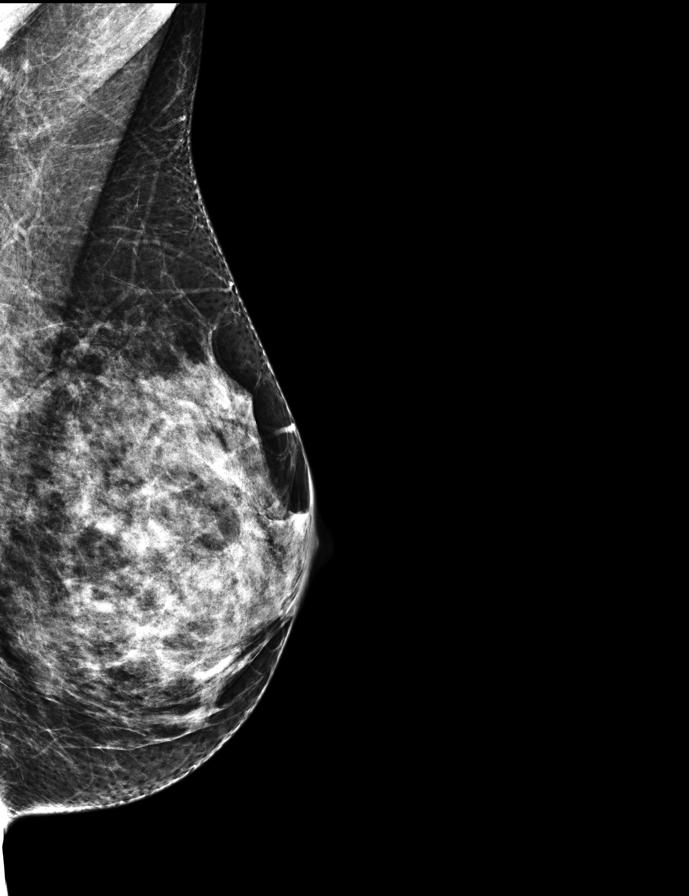

[R MLO]
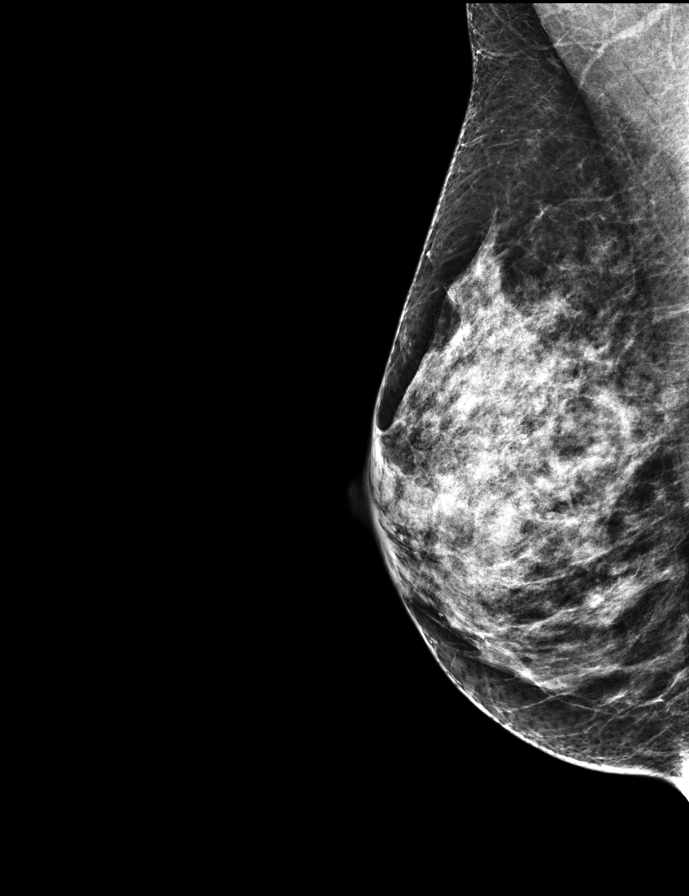

[R CC]
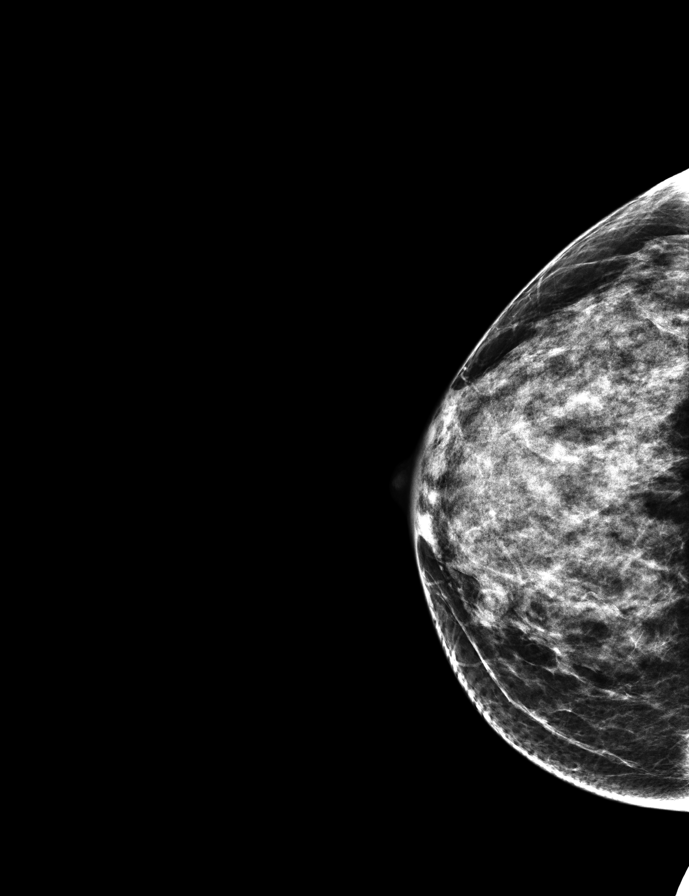

[R MLO synth-2D]
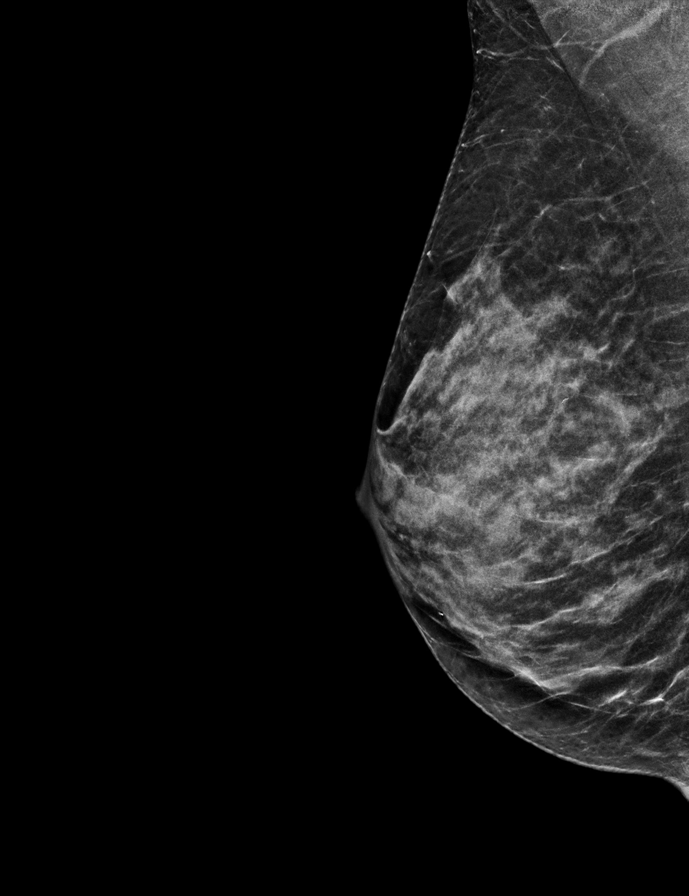

[R CC tomo · tomo slice 25/48.0]
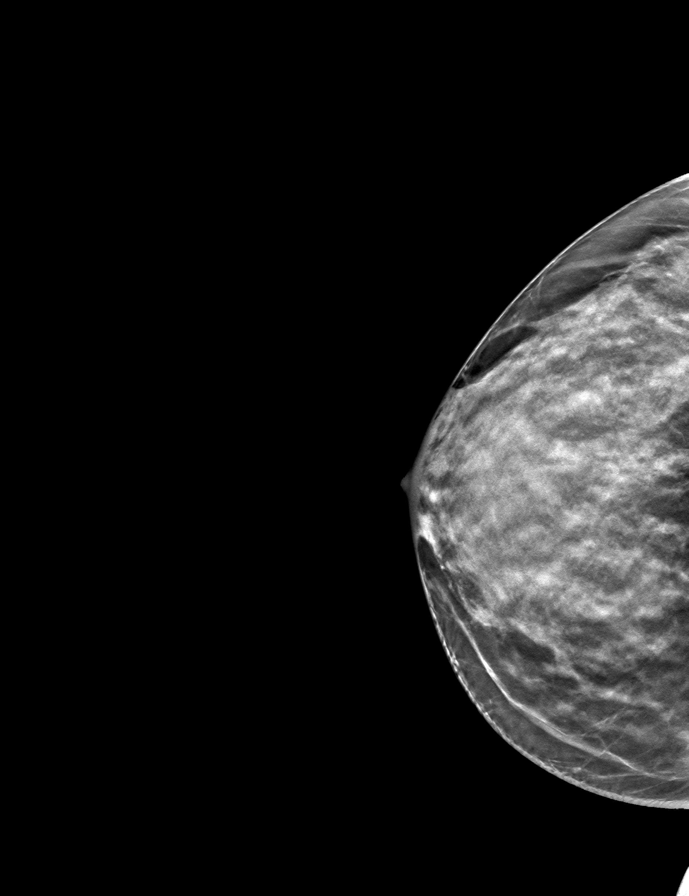

[9 of 28 positions shown; findings below may reference images not displayed]

ACR Breast Density Category d: The breast tissue is extremely dense,
which lowers the sensitivity of mammography.
FINDINGS: There are no findings suspicious for malignancy. Images were
processed with CAD.
IMPRESSION: No mammographic evidence of malignancy. A result letter of this
screening mammogram will be mailed directly to the patient.

RECOMMENDATION:
Screening mammogram in one year. (Code:RA-I-AVB)

BI-RADS CATEGORY  1: Negative.

## 2018-09-09 DIAGNOSIS — R635 Abnormal weight gain: Secondary | ICD-10-CM | POA: Diagnosis not present

## 2018-09-09 DIAGNOSIS — F4323 Adjustment disorder with mixed anxiety and depressed mood: Secondary | ICD-10-CM | POA: Diagnosis not present

## 2018-09-09 DIAGNOSIS — K219 Gastro-esophageal reflux disease without esophagitis: Secondary | ICD-10-CM | POA: Diagnosis not present

## 2019-01-17 DIAGNOSIS — Z803 Family history of malignant neoplasm of breast: Secondary | ICD-10-CM | POA: Diagnosis not present

## 2019-01-17 DIAGNOSIS — Z0000 Encounter for general adult medical examination without abnormal findings: Secondary | ICD-10-CM | POA: Diagnosis not present

## 2019-05-03 DIAGNOSIS — Z872 Personal history of diseases of the skin and subcutaneous tissue: Secondary | ICD-10-CM | POA: Diagnosis not present

## 2019-05-03 DIAGNOSIS — L570 Actinic keratosis: Secondary | ICD-10-CM | POA: Diagnosis not present

## 2019-05-03 DIAGNOSIS — L578 Other skin changes due to chronic exposure to nonionizing radiation: Secondary | ICD-10-CM | POA: Diagnosis not present

## 2019-05-03 DIAGNOSIS — L718 Other rosacea: Secondary | ICD-10-CM | POA: Diagnosis not present

## 2019-06-08 NOTE — Progress Notes (Signed)
Gynecology Annual Exam  PCP: Patient, No Pcp Per  Chief Complaint:  Chief Complaint  Patient presents with  . Gynecologic Exam    History of Present Illness: Cheyenne Cook  is a 49 yo WF G2P2002 who presents for her annual exam. The patient has had some problems with irregular and heavier menses and vasomotor symptoms x3 years. Her menses over the last year have been as close as 11 days apart to 6 months apart.  She had her LMP four months ago from Dec 31-Jan 3.  The flow ranged from spotting to heavy. The heavier days were also accompanied by cramping. She takes ibuprofen 400 mgm two or three times a day with relief. She had some  intermenstrual spotting in [place] She is taking Paxil 10 mgm for vasomotor symptoms and reports that this has been effective in relieving hot flashes. She is also taking Vitamin E 400 IU BID for hot flashes.   The patient is sexually active. She currently uses vasectomy for contraception.  Since her most recent annual exam 06/06/2018, she has had no other changes in her health history. Her most recent Pap smear was 06/06/2018 and was NIL/ negative HRHPV. Remote hx of CIN1 1998. Had cryotherapy. Her most recent mammogram was 08/09/2018 and was negative. There is a positive history of breast cancer in her maternal grandmother, maternal great aunt, and mother. Genetic testing was done. The patient and mother tested negative for HBOC.  Patient's lifetime risk of breast cancer is 23.1%. She has declined MRIs due to cost, but does get 3D mammograms annually. She has also declined MYRISK testing. There is no family history of ovarian cancer The patient does do monthly self breast exams. SHe does not smoke or drink alcohol The patient does get some exercise by walking The patient gets adequate calcium in her diet and calcium supplement. She had a recent cholesterol screen in the last year at work that was normal.   Review of Systems: Review of Systems   Constitutional: Negative for chills, fever and weight loss.  HENT: Negative for congestion, sinus pain and sore throat.   Eyes: Negative for blurred vision and pain.  Respiratory: Negative for hemoptysis, shortness of breath and wheezing.   Cardiovascular: Negative for chest pain, palpitations and leg swelling.  Gastrointestinal: Negative for abdominal pain, blood in stool, diarrhea, heartburn, nausea and vomiting.  Genitourinary: Negative for dysuria, frequency, hematuria and urgency.       Positive for dysmenorrhea, irregular menses, menorrhagia  Musculoskeletal: Negative for back pain, joint pain and myalgias.  Skin: Negative for itching and rash.  Neurological: Negative for dizziness, tingling and headaches.  Endo/Heme/Allergies: Negative for environmental allergies and polydipsia. Does not bruise/bleed easily.       Negative for hirsutism. Positive for hot flashes   Psychiatric/Behavioral: Negative for depression. The patient is not nervous/anxious and does not have insomnia.   All other systems reviewed and are negative.    Past Medical History:  Past Medical History:  Diagnosis Date  . BRCA negative 11/2011  . Cervical dysplasia    1998  . Family history of breast cancer    mother, MGM, MGGM, mat great aunt  . Rosacea     Past Surgical History:  Past Surgical History:  Procedure Laterality Date  . CRYOTHERAPY  07/03/1996   CIN 1    Medications: Allergies:  Allergies  Allergen Reactions  . Erythromycin Base     Gynecologic History: Obstetric History: [id number]  Social  History:  Social History   Socioeconomic History  . Marital status: Married    Spouse name: Not on file  . Number of children: 2  . Years of education: Not on file  . Highest education level: Not on file  Social Needs  . Financial resource strain: Not on file  . Food insecurity - worry: Not on file  . Food insecurity - inability: Not on file  . Transportation needs - medical: Not on file   . Transportation needs - non-medical: Not on file  Occupational History  . Occupation: Accounting  Tobacco Use  . Smoking status: Never Smoker  . Smokeless tobacco: Never Used  Substance and Sexual Activity  . Alcohol use: No  . Drug use: No  . Sexual activity: Yes    Birth control/protection: Other-see comments    Comment: vasectomy  Other Topics Concern  . Not on file  Social History Narrative  . Not on file    Family History:  Family History  Problem Relation Age of Onset  . Breast cancer Mother 28  . Stomach cancer Mother 48  . Valvular heart disease Father        mitral valve surgery  . Breast cancer Maternal Grandmother 82  . Stomach cancer Maternal Grandmother 77  . Melanoma Maternal Grandmother   . Pancreatic cancer Maternal Grandfather   . Breast cancer Other   . Breast cancer Other    Medication:  Current Outpatient Medications:  Current Outpatient Medications:  .  Calcium Carb-Cholecalciferol (CALCIUM-VITAMIN D) 500-200 MG-UNIT tablet, Take 1 tablet by mouth daily., Disp: , Rfl:  .  Cholecalciferol (VITAMIN D3) 25 MCG (1000 UT) CAPS, Take 1 capsule by mouth daily., Disp: , Rfl:  .  ibuprofen (ADVIL,MOTRIN) 200 MG tablet, Take 400 mg by mouth every 4 (four) hours as needed for cramping., Disp: , Rfl:  .  metroNIDAZOLE (METROCREAM) 0.75 % cream, Apply 1 application topically 2 (two) times daily., Disp: , Rfl:  .  PARoxetine (PAXIL) 10 MG tablet, Take 1 tablet (10 mg total) by mouth at bedtime., Disp: 30 tablet, Rfl: 11 .  vitamin E 400 UNIT capsule, Take 400 Units by mouth 2 (two) times daily., Disp: , Rfl:     Physical Exam Vitals: BP 108/64   Pulse 70   Ht '5\' 1"'$  (1.549 m)   Wt 118 lb (53.5 kg)   LMP 02/02/2019   BMI 22.30 kg/m   Physical Exam  Constitutional: She is oriented to person, place, and time and well-developed, well-nourished, and in no distress.  HENT:  Head: Normocephalic.  Eyes: Conjunctivae are normal.  Neck: Normal range of motion.  No thyromegaly present. No cervical lymphadenopathy Cardiovascular: Normal rate and regular rhythm.   No murmur heard. Pulmonary/Chest: Effort normal. She has no wheezes. She has no rales.  Breast: soft, no masses, no skin changes, no nipple retraction. No axillary, supraclavicular or infraclavicular lymphadenopathy. Abdominal: Soft. She exhibits no distension and no mass. There is no tenderness. There is no guarding.  Genitourinary:  Genitourinary Comments: Vulva: no lesions or inflammation Vagina: no inflammation, scant clear discharge, flattened rugae Cervix: stenotic os, NT, anterior, cervix almost flush with posterior fornix. Uterus: MP, nssc, mobile, NT Adnexa: no masses, NT Rectal: no masses, hemoccult negative. Musculoskeletal: She exhibits no edema.  Lymphadenopathy:    She has no cervical adenopathy.  Neurological: She is alert and oriented to person, place, and time. Gait normal.  Skin: Skin is warm and dry. No rash noted.  Psychiatric: Mood,  affect and judgment normal.      Assessment: 49 y.o. [id number] with normal well woman exam Perimenopausal bleeding/ vasomotor symptoms Family history of breast cancer Increased risk of breast cancer  Plan:  1) Cervical cancer screening:Pap not done (patient desires Pap smears every 3 years)  2) Routine healthcare maintenance including cholesterol, diabetes screening done through work  3) Colon cancer screening:  Discussed options and she desires annual hemoccult testing. This was negative today.  4) Patient to schedule annual screening 3D mammogram at [facility] after 08/09/2019 Discussed getting update  MYRISK testing as well as breast MRIs. She is considering.  5) Vasomotor symptoms: Refill Paxil 10 mgm daily.  6) Osteoporosis prevention:.calcium and vitamin D3 supplements/ exercise  7) RTO 1 year and prn. Continue menstrual calendar and report prolonged, too frequent, intermenstrual or persistent heavy bleeding. Would do a  pelvic ultrasound and possible endometrial biopsy  Dalia Heading, CNM

## 2019-06-09 ENCOUNTER — Ambulatory Visit (INDEPENDENT_AMBULATORY_CARE_PROVIDER_SITE_OTHER): Payer: BC Managed Care – PPO | Admitting: Certified Nurse Midwife

## 2019-06-09 ENCOUNTER — Other Ambulatory Visit: Payer: Self-pay

## 2019-06-09 ENCOUNTER — Encounter: Payer: Self-pay | Admitting: Certified Nurse Midwife

## 2019-06-09 VITALS — BP 108/64 | HR 70 | Ht 61.0 in | Wt 118.0 lb

## 2019-06-09 DIAGNOSIS — Z1231 Encounter for screening mammogram for malignant neoplasm of breast: Secondary | ICD-10-CM | POA: Diagnosis not present

## 2019-06-09 DIAGNOSIS — Z01419 Encounter for gynecological examination (general) (routine) without abnormal findings: Secondary | ICD-10-CM | POA: Diagnosis not present

## 2019-06-09 DIAGNOSIS — Z1211 Encounter for screening for malignant neoplasm of colon: Secondary | ICD-10-CM | POA: Diagnosis not present

## 2019-06-09 DIAGNOSIS — Z803 Family history of malignant neoplasm of breast: Secondary | ICD-10-CM | POA: Diagnosis not present

## 2019-06-09 LAB — HEMOCCULT GUIAC POC 1CARD (OFFICE): Fecal Occult Blood, POC: NEGATIVE

## 2019-06-09 MED ORDER — PAROXETINE HCL 10 MG PO TABS: 10.0000 mg | ORAL_TABLET | Freq: Every day | ORAL | 11 refills | Status: DC

## 2019-06-09 NOTE — Patient Instructions (Signed)
Please consider update on Myrisk testing and breast MRIs.

## 2019-08-10 ENCOUNTER — Ambulatory Visit
Admission: RE | Admit: 2019-08-10 | Discharge: 2019-08-10 | Disposition: A | Discharge status: Home / Self Care | Payer: BC Managed Care – PPO | Source: Ambulatory Visit | Attending: Certified Nurse Midwife | Admitting: Certified Nurse Midwife

## 2019-08-10 DIAGNOSIS — Z1231 Encounter for screening mammogram for malignant neoplasm of breast: Secondary | ICD-10-CM | POA: Insufficient documentation

## 2020-01-18 DIAGNOSIS — Z0000 Encounter for general adult medical examination without abnormal findings: Secondary | ICD-10-CM | POA: Diagnosis not present

## 2020-01-18 DIAGNOSIS — Z803 Family history of malignant neoplasm of breast: Secondary | ICD-10-CM | POA: Diagnosis not present

## 2020-04-04 DIAGNOSIS — L739 Follicular disorder, unspecified: Secondary | ICD-10-CM | POA: Diagnosis not present

## 2020-06-05 NOTE — Progress Notes (Signed)
PCP: Patient, No Pcp Per (Inactive)   Chief Complaint  Patient presents with  . Gynecologic Exam    No concerns    HPI:      [name] is a 50 y.o. G2P2002 whose LMP was No LMP recorded. Patient is perimenopausal., presents today for her annual examination.  Her menses are absent due to menopause. No PMB. She does have vasomotor sx and takes paxil with sx improvement.  Sex activity: single partner, contraception - vasectomy. She does not have vaginal dryness.  Last Pap: 06/06/18  Results were: no abnormalities /neg HPV DNA.  Hx of STDs: HPV with cryotherapy in 1998  Last mammogram: 08/10/19  Results were: normal--routine follow-up in 12 months. There is a FH of breast cancer in her mom, MGM, MGGM and mat grt aunt. Pt and her mom are BRCA neg but has declined update testing. IBIS=23%. There is no FH of ovarian cancer. The patient does do self-breast exams. Has not had scr breast MRI.  Colonoscopy: never; dad with lots of polyps   Tobacco use: The patient denies current or previous tobacco use. Alcohol use: none  No drug use Exercise: min active  She does get adequate calcium and Vitamin D in her diet.  Labs with PCP.   Past Medical History:  Diagnosis Date  . BRCA negative 11/2011  . Cervical dysplasia    1998  . Family history of breast cancer    mother, MGM, MGGM, mat great aunt  . Rosacea     Past Surgical History:  Procedure Laterality Date  . CRYOTHERAPY  07/03/1996   CIN 1    Family History  Problem Relation Age of Onset  . Breast cancer Mother 53  . Stomach cancer Mother 27  . Valvular heart disease Father        mitral valve surgery  . Colon polyps Father   . Breast cancer Maternal Grandmother 64  . Stomach cancer Maternal Grandmother 77  . Melanoma Maternal Grandmother   . Lung cancer Maternal Grandfather 75  . Breast cancer Other   . Breast cancer Other   . Lung cancer Paternal Grandfather     Social History   Socioeconomic History   . Marital status: Married    Spouse name: Not on file  . Number of children: 2  . Years of education: Not on file  . Highest education level: Not on file  Occupational History  . Occupation: Accounting  Tobacco Use  . Smoking status: Never Smoker  . Smokeless tobacco: Never Used  Vaping Use  . Vaping Use: Never used  Substance and Sexual Activity  . Alcohol use: No  . Drug use: No  . Sexual activity: Yes    Birth control/protection: Other-see comments    Comment: vasectomy  Other Topics Concern  . Not on file  Social History Narrative  . Not on file   Social Determinants of Health   Financial Resource Strain: Not on file  Food Insecurity: Not on file  Transportation Needs: Not on file  Physical Activity: Not on file  Stress: Not on file  Social Connections: Not on file  Intimate Partner Violence: Not on file     Current Outpatient Medications:  .  Calcium Carb-Cholecalciferol (CALCIUM-VITAMIN D) 500-200 MG-UNIT tablet, Take 1 tablet by mouth daily., Disp: , Rfl:  .  Cholecalciferol (VITAMIN D3) 25 MCG (1000 UT) CAPS, Take 1 capsule by mouth daily., Disp: , Rfl:  .  metroNIDAZOLE (METROCREAM) 0.75 % cream,  Apply 1 application topically 2 (two) times daily., Disp: , Rfl:  .  vitamin E 400 UNIT capsule, Take 400 Units by mouth 2 (two) times daily., Disp: , Rfl:  .  PARoxetine (PAXIL) 10 MG tablet, Take 1 tablet (10 mg total) by mouth at bedtime., Disp: 90 tablet, Rfl: 3     ROS:  Review of Systems  Constitutional: Negative for fatigue, fever and unexpected weight change.  Respiratory: Negative for cough, shortness of breath and wheezing.   Cardiovascular: Negative for chest pain, palpitations and leg swelling.  Gastrointestinal: Negative for blood in stool, constipation, diarrhea, nausea and vomiting.  Endocrine: Negative for cold intolerance, heat intolerance and polyuria.  Genitourinary: Negative for dyspareunia, dysuria, flank pain, frequency, genital sores,  hematuria, menstrual problem, pelvic pain, urgency, vaginal bleeding, vaginal discharge and vaginal pain.  Musculoskeletal: Negative for back pain, joint swelling and myalgias.  Skin: Negative for rash.  Neurological: Negative for dizziness, syncope, light-headedness, numbness and headaches.  Hematological: Negative for adenopathy.  Psychiatric/Behavioral: Negative for agitation, confusion, sleep disturbance and suicidal ideas. The patient is not nervous/anxious.   BREAST: No symptoms   Objective: BP 96/70   Ht _0  (1.549 m)   Wt 116 lb (52.6 kg)   BMI 21.92 kg/m    Physical Exam Constitutional:      Appearance: She is well-developed.  Genitourinary:     Vulva normal.     Right Labia: No rash, tenderness or lesions.    Left Labia: No tenderness, lesions or rash.    No vaginal discharge, erythema or tenderness.      Right Adnexa: not tender and no mass present.    Left Adnexa: not tender and no mass present.    No cervical friability or polyp.     Uterus is not enlarged or tender.  Breasts:     Right: No mass, nipple discharge, skin change or tenderness.     Left: No mass, nipple discharge, skin change or tenderness.    Neck:     Thyroid: No thyromegaly.  Cardiovascular:     Rate and Rhythm: Normal rate and regular rhythm.     Heart sounds: Normal heart sounds. No murmur heard.   Pulmonary:     Effort: Pulmonary effort is normal.     Breath sounds: Normal breath sounds.  Abdominal:     Palpations: Abdomen is soft.     Tenderness: There is no abdominal tenderness. There is no guarding or rebound.  Musculoskeletal:        General: Normal range of motion.     Cervical back: Normal range of motion.  Lymphadenopathy:     Cervical: No cervical adenopathy.  Neurological:     General: No focal deficit present.     Mental Status: She is alert and oriented to person, place, and time.     Cranial Nerves: No cranial nerve deficit.  Skin:    General: Skin is warm and  dry.  Psychiatric:        Mood and Affect: Mood normal.        Behavior: Behavior normal.        Thought Content: Thought content normal.        Judgment: Judgment normal.  Vitals reviewed.     Assessment/Plan:  Encounter for annual routine gynecological examination  Family history of breast cancer - Plan: MM 3D SCREEN BREAST BILATERAL  Increased risk of breast cancer - Plan: MM 3D SCREEN BREAST BILATERAL; pt aware of monthly SBE, yearly CBE  and mammos, as well as scr breast MRI. Sched mammo and f/u if desires scr breast MRI ref.   Encounter for screening mammogram for malignant neoplasm of breast - Plan: MM 3D SCREEN BREAST BILATERAL; pt to sched mammo  Screening for colon cancer - Plan: Ambulatory referral to Gastroenterology; colonoscopy/cologuard discussed. Pt elects colonoscopy. Ref sent.   Vasomotor symptoms due to menopause - Plan: PARoxetine (PAXIL) 10 MG tablet; Rx RF.    Meds ordered this encounter  Medications  . PARoxetine (PAXIL) 10 MG tablet    Sig: Take 1 tablet (10 mg total) by mouth at bedtime.    Dispense:  90 tablet    Refill:  3    Order Specific Question:   Supervising Provider    Answer:   Gae Dry [[id number]]            GYN counsel breast self exam, mammography screening, menopause, adequate intake of calcium and vitamin D, diet and exercise    F/U  Return in about 1 year (around 06/10/2021).  Lakesia Dahle B. Athanasia Stanwood, PA-C 06/10/2020 9:16 AM

## 2020-06-10 ENCOUNTER — Encounter: Payer: Self-pay | Admitting: Obstetrics and Gynecology

## 2020-06-10 ENCOUNTER — Other Ambulatory Visit: Payer: Self-pay

## 2020-06-10 ENCOUNTER — Ambulatory Visit (INDEPENDENT_AMBULATORY_CARE_PROVIDER_SITE_OTHER): Payer: BC Managed Care – PPO | Admitting: Obstetrics and Gynecology

## 2020-06-10 VITALS — BP 96/70 | Ht 61.0 in | Wt 116.0 lb

## 2020-06-10 DIAGNOSIS — Z1231 Encounter for screening mammogram for malignant neoplasm of breast: Secondary | ICD-10-CM

## 2020-06-10 DIAGNOSIS — Z803 Family history of malignant neoplasm of breast: Secondary | ICD-10-CM

## 2020-06-10 DIAGNOSIS — N951 Menopausal and female climacteric states: Secondary | ICD-10-CM

## 2020-06-10 DIAGNOSIS — Z01419 Encounter for gynecological examination (general) (routine) without abnormal findings: Secondary | ICD-10-CM | POA: Diagnosis not present

## 2020-06-10 DIAGNOSIS — Z9189 Other specified personal risk factors, not elsewhere classified: Secondary | ICD-10-CM

## 2020-06-10 DIAGNOSIS — Z1211 Encounter for screening for malignant neoplasm of colon: Secondary | ICD-10-CM

## 2020-06-10 MED ORDER — PAROXETINE HCL 10 MG PO TABS: 10.0000 mg | ORAL_TABLET | Freq: Every day | ORAL | 3 refills | Status: DC

## 2020-06-10 NOTE — Patient Instructions (Addendum)
I value your feedback and you entrusting us with your care. If you get a [facility] patient survey, I would appreciate you taking the time to let us know about your experience today. Thank you!  [facility]: [phone]      

## 2020-07-23 ENCOUNTER — Telehealth (INDEPENDENT_AMBULATORY_CARE_PROVIDER_SITE_OTHER): Payer: BC Managed Care – PPO | Admitting: Gastroenterology

## 2020-07-23 DIAGNOSIS — Z1211 Encounter for screening for malignant neoplasm of colon: Secondary | ICD-10-CM

## 2020-07-23 MED ORDER — PEG 3350-KCL-NA BICARB-NACL 420 G PO SOLR: 4000.0000 mL | Freq: Once | ORAL | 0 refills | Status: AC

## 2020-07-23 NOTE — Progress Notes (Signed)
Gastroenterology Pre-Procedure Review  Request Date: 09/06/20 Requesting Physician: Dr. Tobi Bastos  PATIENT REVIEW QUESTIONS: The patient responded to the following health history questions as indicated:    1. Are you having any GI issues? no 2. Do you have a personal history of Polyps? no 3. Do you have a family history of Colon Cancer or Polyps? yes (father-colon polyps) 4. Diabetes Mellitus? no 5. Joint replacements in the past 12 months?no 6. Major health problems in the past 3 months?no 7. Any artificial heart valves, MVP, or defibrillator?no    MEDICATIONS & ALLERGIES:    Patient reports the following regarding taking any anticoagulation/antiplatelet therapy:   Plavix, Coumadin, Eliquis, Xarelto, Lovenox, Pradaxa, Brilinta, or Effient? no Aspirin? no  Patient confirms/reports the following medications:  Current Outpatient Medications  Medication Sig Dispense Refill   Calcium Carb-Cholecalciferol (CALCIUM-VITAMIN D) 500-200 MG-UNIT tablet Take 1 tablet by mouth daily.     Cholecalciferol (VITAMIN D3) 25 MCG (1000 UT) CAPS Take 1 capsule by mouth daily.     metroNIDAZOLE (METROCREAM) 0.75 % cream Apply 1 application topically 2 (two) times daily.     PARoxetine (PAXIL) 10 MG tablet Take 1 tablet (10 mg total) by mouth at bedtime. 90 tablet 3   vitamin E 400 UNIT capsule Take 400 Units by mouth 2 (two) times daily.     No current facility-administered medications for this visit.    Patient confirms/reports the following allergies:  Allergies  Allergen Reactions   Erythromycin Base     No orders of the defined types were placed in this encounter.   AUTHORIZATION INFORMATION Primary Insurance: 1D#: Group #:  Secondary Insurance: 1D#: Group #:  SCHEDULE INFORMATION: Date: 09/06/20 Time: Location: Tobi Bastos

## 2020-08-12 ENCOUNTER — Other Ambulatory Visit: Payer: Self-pay

## 2020-08-12 ENCOUNTER — Ambulatory Visit
Admission: RE | Admit: 2020-08-12 | Discharge: 2020-08-12 | Disposition: A | Discharge status: Home / Self Care | Payer: BC Managed Care – PPO | Source: Ambulatory Visit | Attending: Obstetrics and Gynecology | Admitting: Obstetrics and Gynecology

## 2020-08-12 DIAGNOSIS — Z803 Family history of malignant neoplasm of breast: Secondary | ICD-10-CM

## 2020-08-12 DIAGNOSIS — Z1231 Encounter for screening mammogram for malignant neoplasm of breast: Secondary | ICD-10-CM | POA: Insufficient documentation

## 2020-08-12 DIAGNOSIS — Z9189 Other specified personal risk factors, not elsewhere classified: Secondary | ICD-10-CM

## 2020-08-30 DIAGNOSIS — L570 Actinic keratosis: Secondary | ICD-10-CM | POA: Diagnosis not present

## 2020-08-30 DIAGNOSIS — D225 Melanocytic nevi of trunk: Secondary | ICD-10-CM | POA: Diagnosis not present

## 2020-08-30 DIAGNOSIS — L578 Other skin changes due to chronic exposure to nonionizing radiation: Secondary | ICD-10-CM | POA: Diagnosis not present

## 2020-08-30 DIAGNOSIS — L718 Other rosacea: Secondary | ICD-10-CM | POA: Diagnosis not present

## 2020-09-06 ENCOUNTER — Encounter
Admission: RE | Disposition: A | Discharge status: Home / Self Care | Payer: Self-pay | Source: Home / Self Care | Attending: Gastroenterology

## 2020-09-06 ENCOUNTER — Ambulatory Visit: Payer: BC Managed Care – PPO | Admitting: Certified Registered"

## 2020-09-06 ENCOUNTER — Ambulatory Visit
Admission: RE | Admit: 2020-09-06 | Discharge: 2020-09-06 | Disposition: A | Discharge status: Home / Self Care | Payer: BC Managed Care – PPO | Attending: Gastroenterology | Admitting: Gastroenterology

## 2020-09-06 DIAGNOSIS — Z79899 Other long term (current) drug therapy: Secondary | ICD-10-CM | POA: Diagnosis not present

## 2020-09-06 DIAGNOSIS — Z881 Allergy status to other antibiotic agents status: Secondary | ICD-10-CM | POA: Diagnosis not present

## 2020-09-06 DIAGNOSIS — Z8371 Family history of colonic polyps: Secondary | ICD-10-CM | POA: Insufficient documentation

## 2020-09-06 DIAGNOSIS — K641 Second degree hemorrhoids: Secondary | ICD-10-CM | POA: Diagnosis not present

## 2020-09-06 DIAGNOSIS — Z1211 Encounter for screening for malignant neoplasm of colon: Secondary | ICD-10-CM | POA: Diagnosis not present

## 2020-09-06 DIAGNOSIS — K649 Unspecified hemorrhoids: Secondary | ICD-10-CM | POA: Diagnosis not present

## 2020-09-06 HISTORY — PX: COLONOSCOPY WITH PROPOFOL: SHX5780

## 2020-09-06 SURGERY — COLONOSCOPY WITH PROPOFOL
Anesthesia: General

## 2020-09-06 MED ORDER — PROPOFOL 500 MG/50ML IV EMUL
INTRAVENOUS | Status: DC | PRN
  Administered 2020-09-06: 120 ug/kg/min via INTRAVENOUS

## 2020-09-06 MED ORDER — LIDOCAINE 2% (20 MG/ML) 5 ML SYRINGE
INTRAMUSCULAR | Status: DC | PRN
  Administered 2020-09-06: 25 mg via INTRAVENOUS

## 2020-09-06 MED ORDER — PROPOFOL 10 MG/ML IV BOLUS
INTRAVENOUS | Status: DC | PRN
  Administered 2020-09-06: 70 mg via INTRAVENOUS
  Administered 2020-09-06: 30 mg via INTRAVENOUS

## 2020-09-06 MED ORDER — SODIUM CHLORIDE 0.9 % IV SOLN
INTRAVENOUS | Status: DC
  Administered 2020-09-06: 20 mL/h via INTRAVENOUS

## 2020-09-06 NOTE — Anesthesia Preprocedure Evaluation (Signed)
Anesthesia Evaluation  Patient identified by MRN, date of birth, ID band Patient awake    Reviewed: Allergy & Precautions, NPO status , Patient's Chart, lab work & pertinent test results  History of Anesthesia Complications Negative for: history of anesthetic complications  Airway Mallampati: III  TM Distance: >3 FB Neck ROM: Full    Dental no notable dental hx. (+) Teeth Intact   Pulmonary neg pulmonary ROS, neg sleep apnea, neg COPD, Patient abstained from smoking.Not current smoker,    Pulmonary exam normal breath sounds clear to auscultation       Cardiovascular Exercise Tolerance: Good METS(-) hypertension(-) CAD and (-) Past MI negative cardio ROS  (-) dysrhythmias  Rhythm:Regular Rate:Normal - Systolic murmurs    Neuro/Psych negative neurological ROS  negative psych ROS   GI/Hepatic neg GERD  ,(+)     (-) substance abuse  ,   Endo/Other  neg diabetes  Renal/GU negative Renal ROS     Musculoskeletal   Abdominal   Peds  Hematology   Anesthesia Other Findings Past Medical History: 11/2011: BRCA negative No date: Cervical dysplasia     Comment:  1998 No date: Family history of breast cancer     Comment:  mother, MGM, MGGM, mat great aunt No date: Rosacea  Reproductive/Obstetrics                             Anesthesia Physical Anesthesia Plan  ASA: 1  Anesthesia Plan: General   Post-op Pain Management:    Induction: Intravenous  PONV Risk Score and Plan: 3 and Ondansetron, Propofol infusion and TIVA  Airway Management Planned: Nasal Cannula  Additional Equipment: None  Intra-op Plan:   Post-operative Plan:   Informed Consent: I have reviewed the patients History and Physical, chart, labs and discussed the procedure including the risks, benefits and alternatives for the proposed anesthesia with the patient or authorized representative who has indicated his/her  understanding and acceptance.     Dental advisory given  Plan Discussed with: CRNA and Surgeon  Anesthesia Plan Comments: (Discussed risks of anesthesia with patient, including possibility of difficulty with spontaneous ventilation under anesthesia necessitating airway intervention, PONV, and rare risks such as cardiac or respiratory or neurological events, and allergic reactions. Patient understands.)        Anesthesia Quick Evaluation

## 2020-09-06 NOTE — Anesthesia Postprocedure Evaluation (Signed)
Anesthesia Post Note  Patient: Cheyenne Cook  Procedure(s) Performed: COLONOSCOPY WITH PROPOFOL  Patient location during evaluation: Endoscopy Anesthesia Type: General Level of consciousness: awake and alert Pain management: pain level controlled Vital Signs Assessment: post-procedure vital signs reviewed and stable Respiratory status: spontaneous breathing, nonlabored ventilation, respiratory function stable and patient connected to nasal cannula oxygen Cardiovascular status: blood pressure returned to baseline and stable Postop Assessment: no apparent nausea or vomiting Anesthetic complications: no   No notable events documented.   Last Vitals:  Vitals:   09/06/20 0845 09/06/20 0855  BP: 104/64 102/70  Pulse: 70 61  Resp: 15 11  Temp:    SpO2: 100% 100%    Last Pain:  Vitals:   09/06/20 0855  TempSrc:   PainSc: 0-No pain                 Corinda Gubler

## 2020-09-06 NOTE — Transfer of Care (Signed)
Immediate Anesthesia Transfer of Care Note  Patient: Cheyenne Cook  Procedure(s) Performed: COLONOSCOPY WITH PROPOFOL  Patient Location: Endoscopy Unit  Anesthesia Type:General  Level of Consciousness: awake  Airway & Oxygen Therapy: Patient Spontanous Breathing  Post-op Assessment: Report given to RN and Post -op Vital signs reviewed and stable  Post vital signs: Reviewed  Last Vitals:  Vitals Value Taken Time  BP 84/55 09/06/20 0836  Temp 36.3 C 09/06/20 0835  Pulse 63 09/06/20 0836  Resp 14 09/06/20 0836  SpO2 100 % 09/06/20 0836  Vitals shown include unvalidated device data.  Last Pain:  Vitals:   09/06/20 0835  TempSrc: Temporal  PainSc: Asleep         Complications: No notable events documented.

## 2020-09-06 NOTE — Op Note (Signed)
[facility] Gastroenterology Patient Name: Cheyenne Cook Procedure Date: 09/06/2020 8:07 AM MRN: [id number] Account #: [id number] Date of Birth: 07/09/1970 Admit Type: Outpatient Age: 50 Room: [facility] ENDO ROOM 4 Gender: Female Note Status: Finalized Procedure:             Colonoscopy Indications:           Colon cancer screening in patient at increased risk:                         Family history of 1st-degree relative with colon polyps Providers:             Wyline Mood MD, MD Referring MD:          No Local Md, MD (Referring MD) Medicines:             Monitored Anesthesia Care Complications:         No immediate complications. Procedure:             Pre-Anesthesia Assessment:                        - Prior to the procedure, a History and Physical was                         performed, and patient medications, allergies and                         sensitivities were reviewed. The patient's tolerance                         of previous anesthesia was reviewed.                        - The risks and benefits of the procedure and the                         sedation options and risks were discussed with the                         patient. All questions were answered and informed                         consent was obtained.                        - ASA Grade Assessment: II - A patient with mild                         systemic disease.                        After obtaining informed consent, the colonoscope was                         passed under direct vision. Throughout the procedure,                         the patient's blood pressure, pulse, and oxygen  saturations were monitored continuously. The                         Colonoscope was introduced through the anus and                         advanced to the the cecum, identified by the                         appendiceal orifice. The colonoscopy was performed                         with  ease. The patient tolerated the procedure well.                         The quality of the bowel preparation was excellent. Findings:      The perianal and digital rectal examinations were normal.      Non-bleeding internal hemorrhoids were found during retroflexion. The       hemorrhoids were large and Grade II (internal hemorrhoids that prolapse       but reduce spontaneously).      The exam was otherwise without abnormality on direct and retroflexion       views. Impression:            - Non-bleeding internal hemorrhoids.                        - The examination was otherwise normal on direct and                         retroflexion views.                        - No specimens collected. Recommendation:        - Discharge patient to home (with escort).                        - Resume previous diet.                        - Continue present medications.                        - Repeat colonoscopy in 5 years for screening purposes. Procedure Code(s):     --- Professional ---                        [phone], Colonoscopy, flexible; diagnostic, including                         collection of specimen(s) by brushing or washing, when                         performed (separate procedure) Diagnosis Code(s):     --- Professional ---                        Z83.71, Family history of colonic polyps                        K64.1, Second degree hemorrhoids  CPT copyright 2019 American Medical Association. All rights reserved. The codes documented in this report are preliminary and upon coder review may  be revised to meet current compliance requirements. Wyline Mood, MD Wyline Mood MD, MD 09/06/2020 8:33:06 AM This report has been signed electronically. Number of Addenda: 0 Note Initiated On: 09/06/2020 8:07 AM Scope Withdrawal Time: 0 hours 9 minutes 48 seconds  Total Procedure Duration: 0 hours 14 minutes 8 seconds  Estimated Blood Loss:  Estimated blood loss: none.      [facility]

## 2020-09-06 NOTE — H&P (Signed)
Jonathon Bellows, MD [address] [place], [place], [place], [address] [address], [place], [place], [place], [address] Phone: [phone]  Fax: [phone]  Primary Care Physician:  Earlie Counts, FNP   Pre-Procedure History & Physical: HPI:  SWARA DONZE is a 50 y.o. female is here for an colonoscopy.   Past Medical History:  Diagnosis Date   BRCA negative 11/2011   Cervical dysplasia    1998   Family history of breast cancer    mother, MGM, MGGM, mat great aunt   Rosacea     Past Surgical History:  Procedure Laterality Date   CRYOTHERAPY  07/03/1996   CIN 1    Prior to Admission medications   Medication Sig Start Date End Date Taking? Authorizing Provider  Calcium Carb-Cholecalciferol (CALCIUM-VITAMIN D) 500-200 MG-UNIT tablet Take 1 tablet by mouth daily.   Yes [provider]  Cholecalciferol (VITAMIN D3) 25 MCG (1000 UT) CAPS Take 1 capsule by mouth daily.   Yes [provider]  metroNIDAZOLE (METROCREAM) 0.75 % cream Apply 1 application topically 2 (two) times daily.   Yes [provider]  PARoxetine (PAXIL) 10 MG tablet Take 1 tablet (10 mg total) by mouth at bedtime. 06/10/83  Yes Copland, Alicia B, PA-C  vitamin E 400 UNIT capsule Take 400 Units by mouth 2 (two) times daily.   Yes [provider]    Allergies as of 07/23/2020 - Review Complete 07/23/2020  Allergen Reaction Noted   Erythromycin base  03/13/2016    Family History  Problem Relation Age of Onset   Breast cancer Mother 11   Stomach cancer Mother 36   Valvular heart disease Father        mitral valve surgery   Colon polyps Father    Breast cancer Maternal Grandmother 42   Stomach cancer Maternal Grandmother 77   Melanoma Maternal Grandmother    Lung cancer Maternal Grandfather 79   Breast cancer Other    Breast cancer Other    Lung cancer Paternal Grandfather     Social History   Socioeconomic History   Marital status: Married    Spouse  name: Not on file   Number of children: 2   Years of education: Not on file   Highest education level: Not on file  Occupational History   Occupation: Accounting  Tobacco Use   Smoking status: Never   Smokeless tobacco: Never  Vaping Use   Vaping Use: Never used  Substance and Sexual Activity   Alcohol use: No   Drug use: No   Sexual activity: Yes    Birth control/protection: Other-see comments    Comment: vasectomy  Other Topics Concern   Not on file  Social History Narrative   Not on file   Social Determinants of Health   Financial Resource Strain: Not on file  Food Insecurity: Not on file  Transportation Needs: Not on file  Physical Activity: Not on file  Stress: Not on file  Social Connections: Not on file  Intimate Partner Violence: Not on file    Review of Systems: See HPI, otherwise negative ROS  Physical Exam: BP 108/70   Pulse 70   Temp (!) 97.3 F (36.3 C) (Temporal)   Resp 20   Ht $R'5\' 1"'YC$  (1.549 m)   Wt 50.8 kg   LMP 02/04/2019   SpO2 100%   BMI 21.16 kg/m  General:   Alert,  pleasant and cooperative in NAD Head:  Normocephalic and  atraumatic. Neck:  Supple; no masses or thyromegaly. Lungs:  Clear throughout to auscultation, normal respiratory effort.    Heart:  +S1, +S2, Regular rate and rhythm, No edema. Abdomen:  Soft, nontender and nondistended. Normal bowel sounds, without guarding, and without rebound.   Neurologic:  Alert and  oriented x4;  grossly normal neurologically.  Impression/Plan: SHARRYN BELDING is here for an colonoscopy to be performed for Screening colonoscopy,father had colon polyps Risks, benefits, limitations, and alternatives regarding  colonoscopy have been reviewed with the patient.  Questions have been answered.  All parties agreeable.   Jonathon Bellows, MD  09/06/2020, 8:06 AM

## 2020-09-09 ENCOUNTER — Encounter: Payer: Self-pay | Admitting: Gastroenterology

## 2021-01-31 ENCOUNTER — Emergency Department: Payer: BC Managed Care – PPO

## 2021-01-31 DIAGNOSIS — I878 Other specified disorders of veins: Secondary | ICD-10-CM | POA: Diagnosis not present

## 2021-01-31 DIAGNOSIS — M7989 Other specified soft tissue disorders: Secondary | ICD-10-CM | POA: Diagnosis not present

## 2021-01-31 DIAGNOSIS — M79662 Pain in left lower leg: Secondary | ICD-10-CM | POA: Diagnosis not present

## 2021-01-31 DIAGNOSIS — R7989 Other specified abnormal findings of blood chemistry: Secondary | ICD-10-CM | POA: Diagnosis not present

## 2021-01-31 DIAGNOSIS — Z0000 Encounter for general adult medical examination without abnormal findings: Secondary | ICD-10-CM | POA: Diagnosis not present

## 2021-01-31 NOTE — ED Triage Notes (Signed)
Pt has had left lower leg pain for the past two weeks. Pt states that she had labwork done today at her PCP and her D-dimer was elevated. Pt has no other complaints at this time. Denies any shortness of breath.

## 2021-02-01 ENCOUNTER — Emergency Department
Admission: EM | Admit: 2021-02-01 | Discharge: 2021-02-01 | Disposition: A | Discharge status: Home / Self Care | Payer: BC Managed Care – PPO | Attending: Emergency Medicine | Admitting: Emergency Medicine

## 2021-02-01 DIAGNOSIS — M79662 Pain in left lower leg: Secondary | ICD-10-CM | POA: Diagnosis not present

## 2021-02-01 DIAGNOSIS — M7918 Myalgia, other site: Secondary | ICD-10-CM

## 2021-02-01 MED ORDER — MEDICAL COMPRESSION STOCKINGS MISC: 0 refills | Status: DC

## 2021-02-01 NOTE — ED Provider Notes (Signed)
[facility]  Emergency Department Provider Note  Time seen: 8:50 AM  I have reviewed the triage vital signs and the nursing notes.   HISTORY  Chief Complaint Positive D-dimer  HPI Cheyenne Cook is a 50 y.o. female with no significant past medical history presents emergency department for a positive D-dimer and left calf discomfort.  According to the patient she went to her PCP yesterday for routine yearly physical.  While there she did mention over the past 2 or 3 weeks she has been experiencing pain in her left calf which she describes as a cramping or aching type pain worse when she sits at her desk all day.  Her doctor ordered a D-dimer as a precaution which resulted positive and sent the patient to the emergency department for evaluation.  Patient denies any chest pain or shortness of breath at any point.  Denies any fever cough.   Past Medical History:  Diagnosis Date   BRCA negative 11/2011   Cervical dysplasia    1998   Family history of breast cancer    mother, MGM, MGGM, mat great aunt   Rosacea     Patient Active Problem List   Diagnosis Date Noted   Rosacea 06/09/2017   Increased risk of breast cancer 04/19/2017   History of cervical dysplasia 04/09/2016   Family history of breast cancer 04/09/2016    Past Surgical History:  Procedure Laterality Date   COLONOSCOPY WITH PROPOFOL N/A 09/06/2020   Procedure: COLONOSCOPY WITH PROPOFOL;  Surgeon: Wyline Mood, MD;  Location: [facility] ENDOSCOPY;  Service: Gastroenterology;  Laterality: N/A;   CRYOTHERAPY  07/03/1996   CIN 1    Prior to Admission medications   Medication Sig Start Date End Date Taking? Authorizing Provider  Calcium Carb-Cholecalciferol (CALCIUM-VITAMIN D) 500-200 MG-UNIT tablet Take 1 tablet by mouth daily.    [provider]  Cholecalciferol (VITAMIN D3) 25 MCG (1000 UT) CAPS Take 1 capsule by mouth daily.    [provider]  metroNIDAZOLE (METROCREAM) 0.75 % cream  Apply 1 application topically 2 (two) times daily.    [provider]  PARoxetine (PAXIL) 10 MG tablet Take 1 tablet (10 mg total) by mouth at bedtime. 06/10/20   Copland, Ilona Sorrel, PA-C  vitamin E 400 UNIT capsule Take 400 Units by mouth 2 (two) times daily.    [provider]    Allergies  Allergen Reactions   Erythromycin Base     Family History  Problem Relation Age of Onset   Breast cancer Mother 67   Stomach cancer Mother 87   Valvular heart disease Father        mitral valve surgery   Colon polyps Father    Breast cancer Maternal Grandmother 54   Stomach cancer Maternal Grandmother 77   Melanoma Maternal Grandmother    Lung cancer Maternal Grandfather 24   Breast cancer Other    Breast cancer Other    Lung cancer Paternal Grandfather     Social History Social History   Tobacco Use   Smoking status: Never   Smokeless tobacco: Never  Vaping Use   Vaping Use: Never used  Substance Use Topics   Alcohol use: No   Drug use: No    Review of Systems Constitutional: Negative for fever. Cardiovascular: Negative for chest pain. Respiratory: Negative for shortness of breath. Gastrointestinal: Negative for abdominal pain Genitourinary: Negative for urinary compaints Musculoskeletal: Left calf discomfort. Neurological: Negative for headache All other ROS negative  ____________________________________________  PHYSICAL EXAM:  VITAL SIGNS: ED Triage Vitals  Enc Vitals Group     BP 01/31/21 2120 122/79     Pulse Rate 01/31/21 2120 75     Resp 01/31/21 2120 20     Temp 01/31/21 2120 98 F (36.7 C)     Temp Source 01/31/21 2120 Oral     SpO2 01/31/21 2120 96 %     Weight 01/31/21 2121 117 lb (53.1 kg)     Height 01/31/21 2121 $RemoveBefor'5\' 1"'DCAJzyOsijNX$  (1.549 m)     Head Circumference --      Peak Flow --      Pain Score 01/31/21 2121 4     Pain Loc --      Pain Edu? --      Excl. in [place]? --    Constitutional: Alert and oriented. Well appearing and in no  distress. Eyes: Normal exam ENT      Head: Normocephalic and atraumatic.      Mouth/Throat: Mucous membranes are moist. Cardiovascular: Warm extremities Respiratory: Normal respiratory effort without tachypnea nor retractions Musculoskeletal: Nontender bilateral lower extremities.  Soft calf compartments.  2+ DP pulse.  Sensation intact.  No edema. Neurologic:  Normal speech and language. No gross focal neurologic deficits  Skin:  Skin is warm, dry and intact.  Psychiatric: Mood and affect are normal.     RADIOLOGY  Ultrasound negative for DVT  ____________________________________________   INITIAL IMPRESSION / ASSESSMENT AND PLAN / ED COURSE  Pertinent labs & imaging results that were available during my care of the patient were reviewed by me and considered in my medical decision making (see chart for details).   Patient presents emergency department for evaluation of left calf pain over the past 2 to 3 weeks with a positive D-dimer at her PCP.  Overall patient appears well, leg appears well, no concerning findings on physical exam.  Reassuring vital signs.  Ultrasound is negative for DVT.  Given the patient's positive D-dimer I did discuss with the patient obtaining lab work and a CT scan of the chest as a precaution.  Patient denies any shortness of breath chest pain discomfort symptoms.  States she is comfortable holding off on CT imaging for now given no symptoms which I believe is completely reasonable.  Patient reassured by her negative ultrasound.  We will discharge with compression stockings to see if this helps the patient's discomfort.  Patient agreeable to plan of care.  Cheyenne Cook was evaluated in Emergency Department on 02/01/2021 for the symptoms described in the history of present illness. She was evaluated in the context of the global COVID-19 pandemic, which necessitated consideration that the patient might be at risk for infection with the SARS-CoV-2 virus that  causes COVID-19. Institutional protocols and algorithms that pertain to the evaluation of patients at risk for COVID-19 are in a state of rapid change based on information released by regulatory bodies including the CDC and federal and state organizations. These policies and algorithms were followed during the patient's care in the ED.  ____________________________________________   FINAL CLINICAL IMPRESSION(S) / ED DIAGNOSES  Musculoskeletal pain   Harvest Dark, MD 02/01/21 [phone]

## 2021-06-11 ENCOUNTER — Ambulatory Visit: Payer: BC Managed Care – PPO | Admitting: Obstetrics and Gynecology

## 2021-06-12 ENCOUNTER — Encounter: Payer: Self-pay | Admitting: Obstetrics and Gynecology

## 2021-06-12 ENCOUNTER — Other Ambulatory Visit (HOSPITAL_COMMUNITY)
Admission: RE | Admit: 2021-06-12 | Discharge: 2021-06-12 | Disposition: A | Discharge status: Home / Self Care | Payer: BC Managed Care – PPO | Source: Ambulatory Visit | Attending: Obstetrics and Gynecology | Admitting: Obstetrics and Gynecology

## 2021-06-12 ENCOUNTER — Ambulatory Visit (INDEPENDENT_AMBULATORY_CARE_PROVIDER_SITE_OTHER): Payer: BC Managed Care – PPO | Admitting: Obstetrics and Gynecology

## 2021-06-12 VITALS — BP 90/60 | Ht 61.0 in | Wt 121.0 lb

## 2021-06-12 DIAGNOSIS — Z124 Encounter for screening for malignant neoplasm of cervix: Secondary | ICD-10-CM

## 2021-06-12 DIAGNOSIS — Z1151 Encounter for screening for human papillomavirus (HPV): Secondary | ICD-10-CM | POA: Diagnosis not present

## 2021-06-12 DIAGNOSIS — Z01419 Encounter for gynecological examination (general) (routine) without abnormal findings: Secondary | ICD-10-CM

## 2021-06-12 DIAGNOSIS — Z808 Family history of malignant neoplasm of other organs or systems: Secondary | ICD-10-CM | POA: Diagnosis not present

## 2021-06-12 DIAGNOSIS — Z9189 Other specified personal risk factors, not elsewhere classified: Secondary | ICD-10-CM

## 2021-06-12 DIAGNOSIS — Z803 Family history of malignant neoplasm of breast: Secondary | ICD-10-CM

## 2021-06-12 DIAGNOSIS — Z87410 Personal history of cervical dysplasia: Secondary | ICD-10-CM | POA: Insufficient documentation

## 2021-06-12 DIAGNOSIS — Z800 Family history of malignant neoplasm of digestive organs: Secondary | ICD-10-CM | POA: Diagnosis not present

## 2021-06-12 DIAGNOSIS — Z1231 Encounter for screening mammogram for malignant neoplasm of breast: Secondary | ICD-10-CM

## 2021-06-12 DIAGNOSIS — Z801 Family history of malignant neoplasm of trachea, bronchus and lung: Secondary | ICD-10-CM | POA: Diagnosis not present

## 2021-06-12 DIAGNOSIS — N951 Menopausal and female climacteric states: Secondary | ICD-10-CM

## 2021-06-12 MED ORDER — PAROXETINE HCL 10 MG PO TABS: 10.0000 mg | ORAL_TABLET | Freq: Every day | ORAL | 3 refills | Status: DC

## 2021-06-12 NOTE — Progress Notes (Signed)
? ?PCP: Cheyenne Rana, MD ? ? ?Chief Complaint  ?Patient presents with  ? Gynecologic Exam  ?  No concerns  ? ? ?HPI: ?     [name] is a 51 y.o. [id number] whose LMP was Patient's last menstrual period was 02/04/2019., presents today for her annual examination.  Her menses are absent due to menopause. No PMB. She does have vasomotor sx and takes paxil with sx improvement. Declines HRT, wants to cont paxil.  ? ?Sex activity: single partner, contraception - vasectomy. She does not have vaginal dryness. ? ?Last Pap: 06/06/18  Results were: no abnormalities /neg HPV DNA.  ?Hx of STDs: HPV with cryotherapy in 1998 ? ?Last mammogram: 08/12/20  Results were: normal--routine follow-up in 12 months. ?There is a FH of breast cancer in her mom, MGM, [place] and mat grt aunt. Pt and her mom are BRCA neg (pt in 2013) but has declined update testing in past; would like it this yr. IBIS=23% 2013. There is no FH of ovarian cancer. The patient does do self-breast exams. Has not had scr breast MRI. ? ?Colonoscopy: 8/22 at [facility]; results were normal, repeat after ? 10 yrs (but dad with hx of "lots of polyps") ? ?Tobacco use: The patient denies current or previous tobacco use. ?Alcohol use: none  ?No drug use ?Exercise: min active ? ?She does get adequate calcium and Vitamin D in her diet. ? ?Labs with PCP. ? ? ?Past Medical History:  ?Diagnosis Date  ? BRCA negative 11/2011  ? Cervical dysplasia   ? 1998  ? Family history of breast cancer   ? mother, MGM, MGGM, mat great aunt  ? Rosacea   ? ? ?Past Surgical History:  ?Procedure Laterality Date  ? COLONOSCOPY WITH PROPOFOL N/A 09/06/2020  ? Procedure: COLONOSCOPY WITH PROPOFOL;  Surgeon: Jonathon Bellows, MD;  Location[facility] END[facility]COPY;  Service: Gastroenterology;  Laterality: N/A;  ? CRYOTHERAPY  07/03/1996  ? CIN 1  ? ? ?Family History  ?Problem Relation Age of Onset  ? Breast cancer Mother 63  ? Stomach cancer Mother 72  ? Valvular heart disease Father   ?     mitral valve surgery   ? Colon polyps Father   ? Breast cancer Maternal Grandmother 72  ? Stomach cancer Maternal Grandmother 71  ? Melanoma Maternal Grandmother   ? Lung cancer Maternal Grandfather 66  ? Breast cancer Other   ? Breast cancer Other   ? Lung cancer Paternal Grandfather   ? ? ?Social History  ? ?Socioeconomic History  ? Marital status: Married  ?  Spouse name: Not on file  ? Number of children: 2  ? Years of education: Not on file  ? Highest education level: Not on file  ?Occupational History  ? Occupation: Accounting  ?Tobacco Use  ? Smoking status: Never  ? Smokeless tobacco: Never  ?Vaping Use  ? Vaping Use: Never used  ?Substance and Sexual Activity  ? Alcohol use: No  ? Drug use: No  ? Sexual activity: Yes  ?  Birth control/protection: Other-see comments, Surgical  ?  Comment: vasectomy  ?Other Topics Concern  ? Not on file  ?Social History Narrative  ? Not on file  ? ?Social Determinants of Health  ? ?Financial Resource Strain: Not on file  ?Food Insecurity: Not on file  ?Transportation Needs: Not on file  ?Physical Activity: Not on file  ?Stress: Not on file  ?Social Connections: Not on file  ?Intimate Partner Violence:  Not on file  ? ? ? ?Current Outpatient Medications:  ?  Calcium Carb-Cholecalciferol (CALCIUM-VITAMIN D) 500-200 MG-UNIT tablet, Take 1 tablet by mouth daily., Disp: , Rfl:  ?  Cholecalciferol (VITAMIN D3) 25 MCG (1000 UT) CAPS, Take 1 capsule by mouth daily., Disp: , Rfl:  ?  metroNIDAZOLE (METROCREAM) 0.75 % cream, Apply 1 application topically 2 (two) times daily., Disp: , Rfl:  ?  vitamin E 400 UNIT capsule, Take 400 Units by mouth 2 (two) times daily., Disp: , Rfl:  ?  PARoxetine (PAXIL) 10 MG tablet, Take 1 tablet (10 mg total) by mouth at bedtime., Disp: 90 tablet, Rfl: 3 ? ? ? ? ?ROS: ? ?Review of Systems  ?Constitutional:  Negative for fatigue, fever and unexpected weight change.  ?Respiratory:  Negative for cough, shortness of breath and wheezing.   ?Cardiovascular:  Negative for chest  pain, palpitations and leg swelling.  ?Gastrointestinal:  Negative for blood in stool, constipation, diarrhea, nausea and vomiting.  ?Endocrine: Negative for cold intolerance, heat intolerance and polyuria.  ?Genitourinary:  Negative for dyspareunia, dysuria, flank pain, frequency, genital sores, hematuria, menstrual problem, pelvic pain, urgency, vaginal bleeding, vaginal discharge and vaginal pain.  ?Musculoskeletal:  Negative for back pain, joint swelling and myalgias.  ?Skin:  Negative for rash.  ?Neurological:  Negative for dizziness, syncope, light-headedness, numbness and headaches.  ?Hematological:  Negative for adenopathy.  ?Psychiatric/Behavioral:  Negative for agitation, confusion, sleep disturbance and suicidal ideas. The patient is not nervous/anxious.  BREAST: No symptoms ? ? ?Objective: ?BP 90/60   Ht $R'5\' 1"'WM$  (1.549 m)   Wt 121 lb (54.9 kg)   LMP 02/04/2019   BMI 22.86 kg/m?  ? ? ?Physical Exam ?Constitutional:   ?   Appearance: She is well-developed.  ?Genitourinary:  ?   Vulva normal.  ?   Right Labia: No rash, tenderness or lesions. ?   Left Labia: No tenderness, lesions or rash. ?   No vaginal discharge, erythema or tenderness.  ? ?   Right Adnexa: not tender and no mass present. ?   Left Adnexa: not tender and no mass present. ?   No cervical friability or polyp.  ?   Uterus is not enlarged or tender.  ?Breasts: ?   Right: No mass, nipple discharge, skin change or tenderness.  ?   Left: No mass, nipple discharge, skin change or tenderness.  ?Neck:  ?   Thyroid: No thyromegaly.  ?Cardiovascular:  ?   Rate and Rhythm: Normal rate and regular rhythm.  ?   Heart sounds: Normal heart sounds. No murmur heard. ?Pulmonary:  ?   Effort: Pulmonary effort is normal.  ?   Breath sounds: Normal breath sounds.  ?Abdominal:  ?   Palpations: Abdomen is soft.  ?   Tenderness: There is no abdominal tenderness. There is no guarding or rebound.  ?Musculoskeletal:     ?   General: Normal range of motion.  ?    Cervical back: Normal range of motion.  ?Lymphadenopathy:  ?   Cervical: No cervical adenopathy.  ?Neurological:  ?   General: No focal deficit present.  ?   Mental Status: She is alert and oriented to person, place, and time.  ?   Cranial Nerves: No cranial nerve deficit.  ?Skin: ?   General: Skin is warm and dry.  ?Psychiatric:     ?   Mood and Affect: Mood normal.     ?   Behavior: Behavior normal.     ?  Thought Content: Thought content normal.     ?   Judgment: Judgment normal.  ?Vitals reviewed.  ? ? ?Assessment/Plan: ? ?Encounter for annual routine gynecological examination ? ?Cervical cancer screening - Plan: Cytology - PAP ? ?Screening for HPV (human papillomavirus) - Plan: Cytology - PAP ? ?History of cervical dysplasia - Plan: Cytology - PAP ? ?Encounter for screening mammogram for malignant neoplasm of breast - Plan: MM 3D SCREEN BREAST BILATERAL; pt to schedule mammo ? ?Family history of breast cancer - Plan: MM 3D SCREEN BREAST BILATERAL, Integrated BRACAnalysis ([place]); MyRisk update testing discussed and done today. Will f/u with results.  ? ?Increased risk of breast cancer - Plan: MM 3D SCREEN BREAST BILATERAL; pt aware of monthly SBE, yearly CBE and mammos. Will see what updated IBIS results are. Hasn't had breast MRI in past.  ? ?Vasomotor symptoms due to menopause - Plan: PARoxetine (PAXIL) 10 MG tablet; Rx RF.  ? ? ?Meds ordered this encounter  ?Medications  ? PARoxetine (PAXIL) 10 MG tablet  ?  Sig: Take 1 tablet (10 mg total) by mouth at bedtime.  ?  Dispense:  90 tablet  ?  Refill:  3  ?  Order Specific Question:   Supervising Provider  ?  Answer:   CONSTANT, PEGGY [[id number]]  ? ? ?        ?GYN counsel breast self exam, mammography screening, menopause, adequate intake of calcium and vitamin D, diet and exercise ? ?  F/U ? Return in about 1 year (around 06/13/2022). ? ?Cheyenne Cook B. Lilia Letterman, PA-C ?06/12/2021 ?4:09 PM ?

## 2021-06-12 NOTE — Patient Instructions (Addendum)
I value your feedback and you entrusting us with your care. If you get a  patient survey, I would appreciate you taking the time to let us know about your experience today. Thank you!  [facility]: [phone]      

## 2021-06-16 LAB — CYTOLOGY - PAP
Comment: NEGATIVE
Diagnosis: NEGATIVE
High risk HPV: NEGATIVE

## 2021-07-17 ENCOUNTER — Telehealth: Payer: Self-pay | Admitting: Obstetrics and Gynecology

## 2021-07-17 ENCOUNTER — Encounter: Payer: Self-pay | Admitting: Obstetrics and Gynecology

## 2021-07-17 NOTE — Telephone Encounter (Signed)
Pt aware of neg UPDATE MyRisk results except RET VUS. IBIS=17.9%/riskcore=21.8%. IBIS=23% in 2013. Aware of recommendations of monthly SBE, yearly CBE and mammos, and scr breast MRI. Pt has mammo sheds 7/23. Will call for breast MRI ref within 6 months of mammo.   Patient understands these results only apply to her and her children, and this is not indicative of genetic testing results of her other family members. It is recommended that her other family members have genetic testing done.  Pt also understands negative genetic testing doesn't mean she will never get any of these cancers.   Hard copy mailed to pt. F/u prn.

## 2021-08-13 ENCOUNTER — Ambulatory Visit
Admission: RE | Admit: 2021-08-13 | Discharge: 2021-08-13 | Disposition: A | Discharge status: Home / Self Care | Payer: BC Managed Care – PPO | Source: Ambulatory Visit | Attending: Obstetrics and Gynecology | Admitting: Obstetrics and Gynecology

## 2021-08-13 DIAGNOSIS — Z9189 Other specified personal risk factors, not elsewhere classified: Secondary | ICD-10-CM

## 2021-08-13 DIAGNOSIS — Z1231 Encounter for screening mammogram for malignant neoplasm of breast: Secondary | ICD-10-CM | POA: Insufficient documentation

## 2021-08-13 DIAGNOSIS — Z803 Family history of malignant neoplasm of breast: Secondary | ICD-10-CM | POA: Insufficient documentation

## 2021-09-02 DIAGNOSIS — L578 Other skin changes due to chronic exposure to nonionizing radiation: Secondary | ICD-10-CM | POA: Diagnosis not present

## 2021-09-02 DIAGNOSIS — D225 Melanocytic nevi of trunk: Secondary | ICD-10-CM | POA: Diagnosis not present

## 2021-09-02 DIAGNOSIS — L570 Actinic keratosis: Secondary | ICD-10-CM | POA: Diagnosis not present

## 2021-09-02 DIAGNOSIS — Z872 Personal history of diseases of the skin and subcutaneous tissue: Secondary | ICD-10-CM | POA: Diagnosis not present

## 2022-06-29 ENCOUNTER — Other Ambulatory Visit: Payer: Self-pay | Admitting: Obstetrics and Gynecology

## 2022-06-29 DIAGNOSIS — N951 Menopausal and female climacteric states: Secondary | ICD-10-CM

## 2022-08-20 ENCOUNTER — Encounter: Payer: Self-pay | Admitting: Obstetrics and Gynecology

## 2022-08-20 ENCOUNTER — Ambulatory Visit: Payer: 59 | Admitting: Obstetrics and Gynecology

## 2022-08-20 VITALS — BP 98/70 | Ht 62.0 in | Wt 122.0 lb

## 2022-08-20 DIAGNOSIS — Z803 Family history of malignant neoplasm of breast: Secondary | ICD-10-CM

## 2022-08-20 DIAGNOSIS — Z01419 Encounter for gynecological examination (general) (routine) without abnormal findings: Secondary | ICD-10-CM

## 2022-08-20 DIAGNOSIS — Z1231 Encounter for screening mammogram for malignant neoplasm of breast: Secondary | ICD-10-CM

## 2022-08-20 DIAGNOSIS — N951 Menopausal and female climacteric states: Secondary | ICD-10-CM

## 2022-08-20 DIAGNOSIS — Z9189 Other specified personal risk factors, not elsewhere classified: Secondary | ICD-10-CM

## 2022-08-20 MED ORDER — PAROXETINE HCL 10 MG PO TABS: 10.0000 mg | ORAL_TABLET | Freq: Every day | ORAL | 3 refills | Status: AC

## 2022-08-20 NOTE — Progress Notes (Signed)
PCP: Dan Humphreys, MD   Chief Complaint  Patient presents with   Gynecologic Exam    No concerns    HPI:      [name] is a 52 y.o. G2P2002 whose LMP was Patient's last menstrual period was 02/04/2019., presents today for her annual examination.  Her menses are absent due to menopause. No PMB. She does have tolerable vasomotor sx and takes paxil with sx improvement. Declined HRT in past, wants to cont paxil.   Sex activity: single partner, contraception - vasectomy. She does not have vaginal dryness/pain/bleeding  Last Pap: 06/12/21 Results were: no abnormalities /neg HPV DNA.  Hx of STDs: HPV with cryotherapy in 1998  Last mammogram: 08/13/21  Results were: normal--routine follow-up in 12 months. There is a FH of breast cancer in her mom, MGM, MGGM and mat grt aunt. Pt and her mom are BRCA neg (pt in 2013); pt with neg UPDATE MyRisk testing except RET VUS 5/23. IBIS=17.9%/riskcore=21.8%. There is no FH of ovarian cancer. The patient does do self-breast exams. Has not had scr breast MRI.  Colonoscopy: 8/22 at [facility]; results were normal, repeat after ? 10 yrs (but dad with hx of "lots of polyps")  Tobacco use: The patient denies current or previous tobacco use. Alcohol use: none  No drug use Exercise: mod active  She does get adequate calcium and Vitamin D in her diet.  Labs with PCP.   Past Medical History:  Diagnosis Date   BRCA negative 11/2011   10/13 BRCA neg; 6/23 MyRisk neg except RET VUS   Cervical dysplasia    1998   Family history of breast cancer    mother, MGM, MGGM, mat great aunt; IBIS=17.9%/riskscore=21.8%   Rosacea     Past Surgical History:  Procedure Laterality Date   COLONOSCOPY WITH PROPOFOL N/A 09/06/2020   Procedure: COLONOSCOPY WITH PROPOFOL;  Surgeon: Wyline Mood, MD;  Location[facility] ENDOSCOPY;  Service: Gastroenterology;  Laterality: N/A;   CRYOTHERAPY  07/03/1996   CIN 1    Family History  Problem Relation Age of Onset    Breast cancer Mother 62   Stomach cancer Mother 20   Valvular heart disease Father        mitral valve surgery   Colon polyps Father    Breast cancer Maternal Grandmother 5   Stomach cancer Maternal Grandmother 77   Melanoma Maternal Grandmother    Lung cancer Maternal Grandfather 27   Breast cancer Other    Breast cancer Other    Lung cancer Paternal Grandfather     Social History   Socioeconomic History   Marital status: Married    Spouse name: Not on file   Number of children: 2   Years of education: Not on file   Highest education level: Not on file  Occupational History   Occupation: Accounting  Tobacco Use   Smoking status: Never   Smokeless tobacco: Never  Vaping Use   Vaping status: Never Used  Substance and Sexual Activity   Alcohol use: No   Drug use: No   Sexual activity: Yes    Birth control/protection: Other-see comments, Surgical    Comment: vasectomy  Other Topics Concern   Not on file  Social History Narrative   Not on file   Social Determinants of Health   Financial Resource Strain: Low Risk  (02/03/2022)   Received from [facility], [facility]   Overall Financial Resource Strain (CARDIA)  Difficulty of Paying Living Expenses: Not hard at all  Food Insecurity: No Food Insecurity (02/03/2022)   [facility] from [facility], [facility]   Hunger Vital Sign    Worried About Running Out of Food in the Last Year: Never true    Ran Out of Food in the Last Year: Never true  Transportation Needs: No Transportation Needs (02/03/2022)   Received from [facility], [facility]   [facility] - Transportation    In the past 12 months, has lack of transportation kept you from medical appointments or from getting medications?: No    Lack of Transportation (Non-Medical): No  Physical Activity: Inactive (06/06/2018)   Exercise Vital Sign    Days of Exercise per  Week: 0 days    Minutes of Exercise per Session: 0 min  Stress: Stress Concern Present (06/06/2018)   [employer] of Occupational Health - Occupational Stress Questionnaire    Feeling of Stress : To some extent  Social Connections: Moderately Integrated (06/06/2018)   Social Connection and Isolation Panel [NHANES]    Frequency of Communication with Friends and Family: More than three times a week    Frequency of Social Gatherings with Friends and Family: More than three times a week    Attends Religious Services: More than 4 times per year    Active Member of [employer] or Organizations: No    Attends Banker Meetings: Never    Marital Status: Married  Catering manager Violence: Not At Risk (06/06/2018)   Humiliation, Afraid, Rape, and Kick questionnaire    Fear of Current or Ex-Partner: No    Emotionally Abused: No    Physically Abused: No    Sexually Abused: No     Current Outpatient Medications:    Calcium Carb-Cholecalciferol (CALCIUM-VITAMIN D) 500-200 MG-UNIT tablet, Take 1 tablet by mouth daily., Disp: , Rfl:    Cholecalciferol (VITAMIN D3) 25 MCG (1000 UT) CAPS, Take 1 capsule by mouth daily., Disp: , Rfl:    metroNIDAZOLE (METROCREAM) 0.75 % cream, Apply 1 application topically 2 (two) times daily., Disp: , Rfl:    vitamin E 400 UNIT capsule, Take 400 Units by mouth 2 (two) times daily., Disp: , Rfl:    PARoxetine (PAXIL) 10 MG tablet, Take 1 tablet (10 mg total) by mouth at bedtime., Disp: 90 tablet, Rfl: 3     ROS:  Review of Systems  Constitutional:  Negative for fatigue, fever and unexpected weight change.  Respiratory:  Negative for cough, shortness of breath and wheezing.   Cardiovascular:  Negative for chest pain, palpitations and leg swelling.  Gastrointestinal:  Negative for blood in stool, constipation, diarrhea, nausea and vomiting.  Endocrine: Negative for cold intolerance, heat intolerance and polyuria.  Genitourinary:  Negative for dyspareunia,  dysuria, flank pain, frequency, genital sores, hematuria, menstrual problem, pelvic pain, urgency, vaginal bleeding, vaginal discharge and vaginal pain.  Musculoskeletal:  Negative for back pain, joint swelling and myalgias.  Skin:  Negative for rash.  Neurological:  Negative for dizziness, syncope, light-headedness, numbness and headaches.  Hematological:  Negative for adenopathy.  Psychiatric/Behavioral:  Negative for agitation, confusion, sleep disturbance and suicidal ideas. The patient is not nervous/anxious.   BREAST: No symptoms   Objective: BP 98/70   Ht 5\' 2"  (1.575 m)   Wt 122 lb (55.3 kg)   LMP 02/04/2019   BMI 22.31 kg/m    Physical Exam Constitutional:      Appearance: She is well-developed.  Genitourinary:     Vulva normal.     Right Labia: No rash, tenderness or lesions.    Left Labia: No tenderness, lesions or rash.    No vaginal discharge, erythema or tenderness.     Moderate vaginal atrophy present.     Right Adnexa: not tender and no mass present.    Left Adnexa: not tender and no mass present.    No cervical friability or polyp.     Uterus is not enlarged or tender.  Breasts:    Right: No mass, nipple discharge, skin change or tenderness.     Left: No mass, nipple discharge, skin change or tenderness.  Neck:     Thyroid: No thyromegaly.  Cardiovascular:     Rate and Rhythm: Normal rate and regular rhythm.     Heart sounds: Normal heart sounds. No murmur heard. Pulmonary:     Effort: Pulmonary effort is normal.     Breath sounds: Normal breath sounds.  Abdominal:     Palpations: Abdomen is soft.     Tenderness: There is no abdominal tenderness. There is no guarding or rebound.  Musculoskeletal:        General: Normal range of motion.     Cervical back: Normal range of motion.  Lymphadenopathy:     Cervical: No cervical adenopathy.  Neurological:     General: No focal deficit present.     Mental Status: She is alert and oriented to person, place,  and time.     Cranial Nerves: No cranial nerve deficit.  Skin:    General: Skin is warm and dry.  Psychiatric:        Mood and Affect: Mood normal.        Behavior: Behavior normal.        Thought Content: Thought content normal.        Judgment: Judgment normal.  Vitals reviewed.     Assessment/Plan:  Encounter for annual routine gynecological examination  Encounter for screening mammogram for malignant neoplasm of breast - Plan: MM 3D SCREENING MAMMOGRAM BILATERAL BREAST; pt to schedule mammo  Family history of breast cancer - Plan: MM 3D SCREENING MAMMOGRAM BILATERAL BREAST; pt is MyRisk neg  Increased risk of breast cancer - Plan: MM 3D SCREENING MAMMOGRAM BILATERAL BREAST; pt aware of recommendations of monthly SBE, yearly CBE and mammos, as well as scr breast MRI. Will call for MRI ref prn. Cont Vit D supp  Vasomotor symptoms due to menopause - Plan: PARoxetine (PAXIL) 10 MG tablet; Rx RF. Doing well   Meds ordered this encounter  Medications   PARoxetine (PAXIL) 10 MG tablet    Sig: Take 1 tablet (10 mg total) by mouth at bedtime.    Dispense:  90 tablet    Refill:  3    Order Specific Question:   Supervising Provider    Answer:   Waymon Budge            GYN counsel breast self exam, mammography screening, menopause, adequate intake of calcium and vitamin D, diet and exercise    F/U  Return in about 1 year (around 08/20/2023).  Lawrie Tunks B. Meylin Stenzel, PA-C 08/20/2022 3:33 PM

## 2022-08-20 NOTE — Patient Instructions (Signed)
I value your feedback and you entrusting us with your care. If you get a [facility] patient survey, I would appreciate you taking the time to let us know about your experience today. Thank you!  [facility] ([place])--[phone]  

## 2022-09-09 ENCOUNTER — Ambulatory Visit
Admission: RE | Admit: 2022-09-09 | Discharge: 2022-09-09 | Disposition: A | Discharge status: Home / Self Care | Payer: 59 | Source: Ambulatory Visit | Attending: Obstetrics and Gynecology | Admitting: Obstetrics and Gynecology

## 2022-09-09 DIAGNOSIS — Z803 Family history of malignant neoplasm of breast: Secondary | ICD-10-CM | POA: Insufficient documentation

## 2022-09-09 DIAGNOSIS — Z9189 Other specified personal risk factors, not elsewhere classified: Secondary | ICD-10-CM | POA: Diagnosis present

## 2022-09-09 DIAGNOSIS — Z1231 Encounter for screening mammogram for malignant neoplasm of breast: Secondary | ICD-10-CM | POA: Insufficient documentation

## 2023-08-10 ENCOUNTER — Other Ambulatory Visit: Payer: Self-pay | Admitting: Obstetrics and Gynecology

## 2023-08-10 DIAGNOSIS — Z1231 Encounter for screening mammogram for malignant neoplasm of breast: Secondary | ICD-10-CM

## 2023-08-23 ENCOUNTER — Ambulatory Visit: Admitting: Obstetrics and Gynecology

## 2023-08-23 ENCOUNTER — Encounter: Payer: Self-pay | Admitting: Obstetrics and Gynecology

## 2023-08-23 VITALS — BP 111/69 | HR 65 | Ht 61.0 in | Wt 120.0 lb

## 2023-08-23 DIAGNOSIS — Z9189 Other specified personal risk factors, not elsewhere classified: Secondary | ICD-10-CM

## 2023-08-23 DIAGNOSIS — Z1231 Encounter for screening mammogram for malignant neoplasm of breast: Secondary | ICD-10-CM

## 2023-08-23 DIAGNOSIS — Z803 Family history of malignant neoplasm of breast: Secondary | ICD-10-CM | POA: Diagnosis not present

## 2023-08-23 DIAGNOSIS — N951 Menopausal and female climacteric states: Secondary | ICD-10-CM

## 2023-08-23 DIAGNOSIS — Z01419 Encounter for gynecological examination (general) (routine) without abnormal findings: Secondary | ICD-10-CM | POA: Diagnosis not present

## 2023-08-23 DIAGNOSIS — N941 Unspecified dyspareunia: Secondary | ICD-10-CM

## 2023-08-23 NOTE — Patient Instructions (Signed)
 I value your feedback and you entrusting [place] with your care. If you get a [facility] patient survey, I would appreciate you taking the time to let us know about your experience today. Thank you! ? ? ?

## 2023-08-23 NOTE — Progress Notes (Signed)
 PCP: Cheyenne Marlee RODES, MD   Chief Complaint  Patient presents with   Gynecologic Exam    Tenderness on LB x 1 week    HPI:      [name] is a 53 y.o. [id number] whose LMP was Patient's last menstrual period was 02/04/2019., presents today for her annual examination.  Her menses are absent due to menopause. No PMB. She does have tolerable vasomotor sx and stopped paxil  a couple months ago. Doing well. Never did HRT.   Sex activity: single partner, contraception - vasectomy. She does have vaginal dryness/pain, no bleeding. OTC lubricants are irritating to her.   Last Pap: 06/12/21 Results were: no abnormalities /neg HPV DNA.  Hx of STDs: HPV with cryotherapy in 1998  Last mammogram: 09/09/22  Results were: normal--routine follow-up in 12 months. Has 09/13/23 There is a FH of breast cancer in her mom, MGM, MGGM and mat grt aunt. Pt and her mom are BRCA neg (pt in 2013); pt with neg UPDATE MyRisk testing except RET VUS 5/23. IBIS=17.9%/riskcore=21.8%. There is no FH of ovarian cancer. The patient does do self-breast exams. Has not had scr breast MRI. Noticed LT breast tenderness towards axilla for past wk, no masses. No caffeine use.   Colonoscopy: 8/22 at [facility]; results were normal, repeat after ? 10 yrs (but dad with hx of lots of polyps)  Tobacco use: The patient denies current or previous tobacco use. Alcohol use: none  No drug use Exercise: mod active  She does get adequate calcium and Vitamin D in her diet.  Labs with PCP.   Past Medical History:  Diagnosis Date   BRCA negative 11/2011   10/13 BRCA neg; 6/23 MyRisk neg except RET VUS   Cervical dysplasia    1998   Family history of breast cancer    mother, MGM, MGGM, mat great aunt; IBIS=17.9%/riskscore=21.8%   Rosacea     Past Surgical History:  Procedure Laterality Date   COLONOSCOPY WITH PROPOFOL  N/A 09/06/2020   Procedure: COLONOSCOPY WITH PROPOFOL ;  Surgeon: Therisa Bi, MD;  Location: [facility] ENDOSCOPY;   Service: Gastroenterology;  Laterality: N/A;   CRYOTHERAPY  07/03/1996   CIN 1    Family History  Problem Relation Age of Onset   Breast cancer Mother 64   Stomach cancer Mother 26   Valvular heart disease Father        mitral valve surgery   Colon polyps Father    Breast cancer Maternal Grandmother 76   Stomach cancer Maternal Grandmother 77   Melanoma Maternal Grandmother    Lung cancer Maternal Grandfather 41   Breast cancer Other    Breast cancer Other    Lung cancer Paternal Grandfather     Social History   Socioeconomic History   Marital status: Married    Spouse name: Not on file   Number of children: 2   Years of education: Not on file   Highest education level: Not on file  Occupational History   Occupation: Accounting  Tobacco Use   Smoking status: Never   Smokeless tobacco: Never  Vaping Use   Vaping status: Never Used  Substance and Sexual Activity   Alcohol use: No   Drug use: No   Sexual activity: Yes    Birth control/protection: Other-see comments, Surgical    Comment: vasectomy  Other Topics Concern   Not on file  Social History Narrative   Not on file   Social Drivers of Health   Financial  Resource Strain: Low Risk  (04/04/2023)   Received from [facility]   Overall Financial Resource Strain (CARDIA)    Difficulty of Paying Living Expenses: Not hard at all  Food Insecurity: No Food Insecurity (04/04/2023)   Received from [facility]   Hunger Vital Sign    Within the past 12 months, you worried that your food would run out before you got the money to buy more.: Never true    Within the past 12 months, the food you bought just didn't last and you didn't have money to get more.: Never true  Transportation Needs: No Transportation Needs (04/04/2023)   Received from [facility] - Transportation    In the past 12 months, has lack of transportation kept you from medical appointments or  from getting medications?: No    Lack of Transportation (Non-Medical): No  Physical Activity: Inactive (06/06/2018)   Exercise Vital Sign    Days of Exercise per Week: 0 days    Minutes of Exercise per Session: 0 min  Stress: Stress Concern Present (06/06/2018)   [employer] of Occupational Health - Occupational Stress Questionnaire    Feeling of Stress : To some extent  Social Connections: Moderately Integrated (06/06/2018)   Social Connection and Isolation Panel    Frequency of Communication with Friends and Family: More than three times a week    Frequency of Social Gatherings with Friends and Family: More than three times a week    Attends Religious Services: More than 4 times per year    Active Member of [employer] or Organizations: No    Attends Banker Meetings: Never    Marital Status: Married  Catering manager Violence: Not At Risk (06/06/2018)   Humiliation, Afraid, Rape, and Kick questionnaire    Fear of Current or Ex-Partner: No    Emotionally Abused: No    Physically Abused: No    Sexually Abused: No     Current Outpatient Medications:    Calcium Carb-Cholecalciferol (CALCIUM-VITAMIN D) 500-200 MG-UNIT tablet, Take 1 tablet by mouth daily., Disp: , Rfl:    Cholecalciferol (VITAMIN D3) 25 MCG (1000 UT) CAPS, Take 1 capsule by mouth daily., Disp: , Rfl:    Loratadine (CLARITIN PO), Take by mouth., Disp: , Rfl:    metroNIDAZOLE (METROCREAM) 0.75 % cream, Apply 1 application topically 2 (two) times daily., Disp: , Rfl:    vitamin E 400 UNIT capsule, Take 400 Units by mouth 2 (two) times daily., Disp: , Rfl:    PARoxetine  (PAXIL ) 10 MG tablet, Take 1 tablet (10 mg total) by mouth at bedtime. (Patient not taking: Reported on 08/23/2023), Disp: 90 tablet, Rfl: 3     ROS:  Review of Systems  Constitutional:  Negative for fatigue, fever and unexpected weight change.  Respiratory:  Negative for cough, shortness of breath and wheezing.   Cardiovascular:  Negative for  chest pain, palpitations and leg swelling.  Gastrointestinal:  Negative for blood in stool, constipation, diarrhea, nausea and vomiting.  Endocrine: Negative for cold intolerance, heat intolerance and polyuria.  Genitourinary:  Positive for dyspareunia. Negative for dysuria, flank pain, frequency, genital sores, hematuria, menstrual problem, pelvic pain, urgency, vaginal bleeding, vaginal discharge and vaginal pain.  Musculoskeletal:  Negative for back pain, joint swelling and myalgias.  Skin:  Negative for rash.  Neurological:  Negative for dizziness, syncope, light-headedness, numbness and headaches.  Hematological:  Negative for adenopathy.  Psychiatric/Behavioral:  Negative for agitation, confusion, sleep  disturbance and suicidal ideas. The patient is not nervous/anxious.   BREAST: No symptoms   Objective: BP 111/69   Pulse 65   Ht 5' 1 (1.549 m)   Wt 120 lb (54.4 kg)   LMP 02/04/2019   BMI 22.67 kg/m    Physical Exam Constitutional:      Appearance: She is well-developed.  Genitourinary:     Vulva normal.     Right Labia: No rash, tenderness or lesions.    Left Labia: No tenderness, lesions or rash.    No vaginal discharge, erythema or tenderness.     Moderate vaginal atrophy present.     Right Adnexa: not tender and no mass present.    Left Adnexa: not tender and no mass present.    No cervical friability or polyp.     Uterus is not enlarged or tender.  Breasts:    Right: No mass, nipple discharge, skin change or tenderness.     Left: Tenderness present. No mass, nipple discharge or skin change.  Neck:     Thyroid : No thyromegaly.  Cardiovascular:     Rate and Rhythm: Normal rate and regular rhythm.     Heart sounds: Normal heart sounds. No murmur heard. Pulmonary:     Effort: Pulmonary effort is normal.     Breath sounds: Normal breath sounds.  Chest:    Abdominal:     Palpations: Abdomen is soft.     Tenderness: There is no abdominal tenderness. There is  no guarding or rebound.  Musculoskeletal:        General: Normal range of motion.     Cervical back: Normal range of motion.  Lymphadenopathy:     Cervical: No cervical adenopathy.  Neurological:     General: No focal deficit present.     Mental Status: She is alert and oriented to person, place, and time.     Cranial Nerves: No cranial nerve deficit.  Skin:    General: Skin is warm and dry.  Psychiatric:        Mood and Affect: Mood normal.        Behavior: Behavior normal.        Thought Content: Thought content normal.        Judgment: Judgment normal.  Vitals reviewed.     Assessment/Plan:  Encounter for annual routine gynecological examination  Encounter for screening mammogram for malignant neoplasm of breast; pt has mammo appt. If LT breast tenderness persists, will change order to dx with u/s.   Family history of breast cancer--pt is MyRisk neg  Increased risk of breast cancer--aware of recommendations of monthly SBE, yearly CBE and mammos, as well as scr breast MRI. Will call for MRI ref prn.   Vasomotor symptoms due to menopause--tolerable, f/u prn.   Dyspareunia in female--try coconut oil as lubricant. If sx persist, pt will f/u to add vag ERT.          GYN counsel breast self exam, mammography screening, menopause, adequate intake of calcium and vitamin D, diet and exercise    F/U  Return in about 1 year (around 08/22/2024).  Caeleb Batalla B. Dae Highley, PA-C 08/23/2023 4:50 PM

## 2023-09-13 ENCOUNTER — Other Ambulatory Visit: Payer: Self-pay | Admitting: Obstetrics and Gynecology

## 2023-09-13 ENCOUNTER — Ambulatory Visit
Admission: RE | Admit: 2023-09-13 | Discharge: 2023-09-13 | Disposition: A | Discharge status: Home / Self Care | Source: Ambulatory Visit | Attending: Obstetrics and Gynecology | Admitting: Obstetrics and Gynecology

## 2023-09-13 DIAGNOSIS — Z1231 Encounter for screening mammogram for malignant neoplasm of breast: Secondary | ICD-10-CM | POA: Diagnosis present

## 2023-09-13 DIAGNOSIS — N951 Menopausal and female climacteric states: Secondary | ICD-10-CM

## 2023-09-16 ENCOUNTER — Ambulatory Visit: Payer: Self-pay | Admitting: Obstetrics and Gynecology
